# Patient Record
Sex: Male | Born: 1975 | Race: White | Hispanic: No | Marital: Married | Smoking: Former smoker
Health system: Southern US, Community
[De-identification: ages and names within clinical notes are randomized; demographics above are authoritative.]

## PROBLEM LIST (undated history)

## (undated) DIAGNOSIS — M549 Dorsalgia, unspecified: Secondary | ICD-10-CM

## (undated) DIAGNOSIS — W3400XA Accidental discharge from unspecified firearms or gun, initial encounter: Secondary | ICD-10-CM

## (undated) DIAGNOSIS — E7800 Pure hypercholesterolemia, unspecified: Secondary | ICD-10-CM

---

## 2011-10-19 ENCOUNTER — Emergency Department (HOSPITAL_COMMUNITY): Payer: BC Managed Care – PPO

## 2011-10-19 ENCOUNTER — Emergency Department (HOSPITAL_COMMUNITY)
Admission: EM | Admit: 2011-10-19 | Discharge: 2011-10-19 | Disposition: A | Discharge status: Home Health | Payer: BC Managed Care – PPO | Attending: Emergency Medicine | Admitting: Emergency Medicine

## 2011-10-19 ENCOUNTER — Encounter (HOSPITAL_COMMUNITY): Payer: Self-pay

## 2011-10-19 DIAGNOSIS — S46909A Unspecified injury of unspecified muscle, fascia and tendon at shoulder and upper arm level, unspecified arm, initial encounter: Secondary | ICD-10-CM | POA: Insufficient documentation

## 2011-10-19 DIAGNOSIS — F17200 Nicotine dependence, unspecified, uncomplicated: Secondary | ICD-10-CM | POA: Insufficient documentation

## 2011-10-19 DIAGNOSIS — R296 Repeated falls: Secondary | ICD-10-CM | POA: Insufficient documentation

## 2011-10-19 DIAGNOSIS — M25519 Pain in unspecified shoulder: Secondary | ICD-10-CM | POA: Insufficient documentation

## 2011-10-19 DIAGNOSIS — S46009A Unspecified injury of muscle(s) and tendon(s) of the rotator cuff of unspecified shoulder, initial encounter: Secondary | ICD-10-CM

## 2011-10-19 DIAGNOSIS — S40011A Contusion of right shoulder, initial encounter: Secondary | ICD-10-CM

## 2011-10-19 DIAGNOSIS — S40019A Contusion of unspecified shoulder, initial encounter: Secondary | ICD-10-CM | POA: Insufficient documentation

## 2011-10-19 DIAGNOSIS — S4980XA Other specified injuries of shoulder and upper arm, unspecified arm, initial encounter: Secondary | ICD-10-CM | POA: Insufficient documentation

## 2011-10-19 MED ORDER — CYCLOBENZAPRINE HCL 10 MG PO TABS: 10.0000 mg | ORAL_TABLET | Freq: Three times a day (TID) | ORAL | Status: AC | PRN

## 2011-10-19 MED ORDER — IBUPROFEN 800 MG PO TABS
800.0000 mg | ORAL_TABLET | Freq: Once | ORAL | Status: AC
  Administered 2011-10-19: 800 mg via ORAL
  Filled 2011-10-19: qty 1

## 2011-10-19 MED ORDER — TRAMADOL-ACETAMINOPHEN 37.5-325 MG PO TABS: ORAL_TABLET | ORAL | Status: AC

## 2011-10-19 MED ORDER — CYCLOBENZAPRINE HCL 10 MG PO TABS: 10.0000 mg | ORAL_TABLET | Freq: Three times a day (TID) | ORAL | Status: DC | PRN

## 2011-10-19 MED ORDER — HYDROCODONE-ACETAMINOPHEN 5-325 MG PO TABS
2.0000 | ORAL_TABLET | Freq: Once | ORAL | Status: AC
  Administered 2011-10-19: 2 via ORAL
  Filled 2011-10-19 (×2): qty 1

## 2011-10-19 NOTE — Discharge Instructions (Signed)
Use the ice packs for comfort. Wear the sling for comfort, you can take it off to bathe. Take the ultracet for pain with the flexeril and ibuprofen 600 mg 4 times a day.  Call Dr Aluisio's office to get an appointment to have your shoulder reevaluated for a rotator cuff injury.
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
## 2011-10-19 NOTE — ED Notes (Signed)
Pt fell and landed on right shoulder, heard something stretched and popped. Pain at Right shoulder.

## 2013-08-10 ENCOUNTER — Emergency Department (HOSPITAL_COMMUNITY)
Admission: EM | Admit: 2013-08-10 | Discharge: 2013-08-10 | Disposition: A | Discharge status: Home / Self Care | Payer: BC Managed Care – PPO | Attending: Emergency Medicine | Admitting: Emergency Medicine

## 2013-08-10 ENCOUNTER — Encounter (HOSPITAL_COMMUNITY): Payer: Self-pay | Admitting: Emergency Medicine

## 2013-08-10 ENCOUNTER — Emergency Department (HOSPITAL_COMMUNITY): Payer: BC Managed Care – PPO

## 2013-08-10 DIAGNOSIS — Y9389 Activity, other specified: Secondary | ICD-10-CM | POA: Insufficient documentation

## 2013-08-10 DIAGNOSIS — S81009A Unspecified open wound, unspecified knee, initial encounter: Secondary | ICD-10-CM | POA: Insufficient documentation

## 2013-08-10 DIAGNOSIS — Z79899 Other long term (current) drug therapy: Secondary | ICD-10-CM | POA: Insufficient documentation

## 2013-08-10 DIAGNOSIS — Y92410 Unspecified street and highway as the place of occurrence of the external cause: Secondary | ICD-10-CM | POA: Insufficient documentation

## 2013-08-10 DIAGNOSIS — Z87891 Personal history of nicotine dependence: Secondary | ICD-10-CM | POA: Insufficient documentation

## 2013-08-10 DIAGNOSIS — Z23 Encounter for immunization: Secondary | ICD-10-CM | POA: Insufficient documentation

## 2013-08-10 DIAGNOSIS — S81812A Laceration without foreign body, left lower leg, initial encounter: Secondary | ICD-10-CM

## 2013-08-10 DIAGNOSIS — E7800 Pure hypercholesterolemia, unspecified: Secondary | ICD-10-CM | POA: Insufficient documentation

## 2013-08-10 HISTORY — DX: Pure hypercholesterolemia, unspecified: E78.00

## 2013-08-10 HISTORY — DX: Accidental discharge from unspecified firearms or gun, initial encounter: W34.00XA

## 2013-08-10 HISTORY — DX: Dorsalgia, unspecified: M54.9

## 2013-08-10 MED ORDER — OXYCODONE-ACETAMINOPHEN 5-325 MG PO TABS
2.0000 | ORAL_TABLET | Freq: Once | ORAL | Status: AC
  Administered 2013-08-10: 2 via ORAL
  Filled 2013-08-10: qty 2

## 2013-08-10 MED ORDER — TETANUS-DIPHTHERIA TOXOIDS TD 5-2 LFU IM INJ
0.5000 mL | INJECTION | Freq: Once | INTRAMUSCULAR | Status: DC
  Filled 2013-08-10: qty 0.5

## 2013-08-10 MED ORDER — TETANUS-DIPHTH-ACELL PERTUSSIS 5-2.5-18.5 LF-MCG/0.5 IM SUSP
0.5000 mL | Freq: Once | INTRAMUSCULAR | Status: AC
  Administered 2013-08-10: 0.5 mL via INTRAMUSCULAR

## 2013-08-10 MED ORDER — OXYCODONE-ACETAMINOPHEN 7.5-325 MG PO TABS: 1.0000 | ORAL_TABLET | ORAL | Status: DC | PRN

## 2013-08-10 NOTE — ED Notes (Signed)
Patient transported to X-ray 

## 2013-08-10 NOTE — ED Notes (Signed)
Pt still in Radiology at this time.
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
## 2013-08-24 ENCOUNTER — Emergency Department (HOSPITAL_COMMUNITY)
Admission: EM | Admit: 2013-08-24 | Discharge: 2013-08-24 | Disposition: A | Discharge status: Home / Self Care | Payer: BC Managed Care – PPO | Attending: Emergency Medicine | Admitting: Emergency Medicine

## 2013-08-24 ENCOUNTER — Encounter (HOSPITAL_COMMUNITY): Payer: Self-pay | Admitting: Emergency Medicine

## 2013-08-24 DIAGNOSIS — Z4802 Encounter for removal of sutures: Secondary | ICD-10-CM

## 2013-08-24 DIAGNOSIS — E7800 Pure hypercholesterolemia, unspecified: Secondary | ICD-10-CM | POA: Insufficient documentation

## 2013-08-24 DIAGNOSIS — Z79899 Other long term (current) drug therapy: Secondary | ICD-10-CM | POA: Insufficient documentation

## 2013-08-24 DIAGNOSIS — Z87891 Personal history of nicotine dependence: Secondary | ICD-10-CM | POA: Insufficient documentation
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
## 2013-08-24 NOTE — ED Provider Notes (Signed)
Medical screening examination/treatment/procedure(s) were performed by non-physician practitioner and as supervising physician I was immediately available for consultation/collaboration.  EKG Interpretation   None         Crysten Kaman, MD 08/24/13 1543 

## 2013-08-24 NOTE — Discharge Instructions (Signed)
Continue taking clindamycin until finished. Monitor wound for increased signs of redness, swelling, drainage, etc.-- return to the ED if these symptoms occur.

## 2013-08-24 NOTE — ED Notes (Signed)
Staples placed here two weeks ago. Seen PCP last week. Placed on Clindamycin for wound complications.

## 2016-03-31 ENCOUNTER — Encounter (HOSPITAL_COMMUNITY): Payer: Self-pay | Admitting: Emergency Medicine

## 2016-03-31 ENCOUNTER — Emergency Department (HOSPITAL_COMMUNITY): Payer: BLUE CROSS/BLUE SHIELD

## 2016-03-31 ENCOUNTER — Emergency Department (HOSPITAL_COMMUNITY)
Admission: EM | Admit: 2016-03-31 | Discharge: 2016-03-31 | Disposition: A | Discharge status: Home / Self Care | Payer: BLUE CROSS/BLUE SHIELD | Attending: Emergency Medicine | Admitting: Emergency Medicine

## 2016-03-31 DIAGNOSIS — R1013 Epigastric pain: Secondary | ICD-10-CM | POA: Diagnosis not present

## 2016-03-31 DIAGNOSIS — Z87891 Personal history of nicotine dependence: Secondary | ICD-10-CM | POA: Diagnosis not present

## 2016-03-31 DIAGNOSIS — R109 Unspecified abdominal pain: Secondary | ICD-10-CM

## 2016-03-31 LAB — COMPREHENSIVE METABOLIC PANEL
ALBUMIN: 4.1 g/dL (ref 3.5–5.0)
ALK PHOS: 66 U/L (ref 38–126)
ALT: 24 U/L (ref 17–63)
AST: 22 U/L (ref 15–41)
Anion gap: 8 (ref 5–15)
BUN: 11 mg/dL (ref 6–20)
CALCIUM: 9.7 mg/dL (ref 8.9–10.3)
CHLORIDE: 102 mmol/L (ref 101–111)
CO2: 27 mmol/L (ref 22–32)
CREATININE: 0.96 mg/dL (ref 0.61–1.24)
GFR calc non Af Amer: >60 mL/min (ref >60)
Glucose, Bld: 124 mg/dL — ABNORMAL HIGH (ref 65–99)
Potassium: 3.7 mmol/L (ref 3.5–5.1)
SODIUM: 137 mmol/L (ref 135–145)
Total Bilirubin: 0.6 mg/dL (ref 0.3–1.2)
Total Protein: 7 g/dL (ref 6.5–8.1)

## 2016-03-31 LAB — CBC
HCT: 46.5 % (ref 39.0–52.0)
Hemoglobin: 15.8 g/dL (ref 13.0–17.0)
MCH: 30.6 pg (ref 26.0–34.0)
MCHC: 34 g/dL (ref 30.0–36.0)
MCV: 89.9 fL (ref 78.0–100.0)
PLATELETS: 235 10*3/uL (ref 150–400)
RBC: 5.17 MIL/uL (ref 4.22–5.81)
RDW: 12.6 % (ref 11.5–15.5)
WBC: 9.1 10*3/uL (ref 4.0–10.5)

## 2016-03-31 LAB — URINALYSIS, ROUTINE W REFLEX MICROSCOPIC
Bilirubin Urine: NEGATIVE
Glucose, UA: NEGATIVE mg/dL
Hgb urine dipstick: NEGATIVE
Ketones, ur: 15 mg/dL — AB
LEUKOCYTES UA: NEGATIVE
NITRITE: NEGATIVE
PROTEIN: NEGATIVE mg/dL
Specific Gravity, Urine: 1.014 (ref 1.005–1.030)
pH: 8.5 — ABNORMAL HIGH (ref 5.0–8.0)

## 2016-03-31 LAB — LIPASE, BLOOD: LIPASE: 25 U/L (ref 11–51)

## 2016-03-31 LAB — CBG MONITORING, ED: GLUCOSE-CAPILLARY: 116 mg/dL — AB (ref 65–99)

## 2016-03-31 MED ORDER — SODIUM CHLORIDE 0.9 % IV SOLN
Freq: Once | INTRAVENOUS | Status: AC
  Administered 2016-03-31: 09:00:00 via INTRAVENOUS

## 2016-03-31 MED ORDER — MORPHINE SULFATE (PF) 4 MG/ML IV SOLN
4.0000 mg | Freq: Once | INTRAVENOUS | Status: AC
  Administered 2016-03-31: 4 mg via INTRAVENOUS
  Filled 2016-03-31: qty 1

## 2016-03-31 MED ORDER — DICYCLOMINE HCL 10 MG/ML IM SOLN
20.0000 mg | Freq: Once | INTRAMUSCULAR | Status: AC
Start: 2016-03-31 — End: 2016-03-31
  Administered 2016-03-31: 20 mg via INTRAMUSCULAR
  Filled 2016-03-31: qty 2

## 2016-03-31 MED ORDER — ONDANSETRON HCL 4 MG/2ML IJ SOLN
4.0000 mg | Freq: Once | INTRAMUSCULAR | Status: AC
  Administered 2016-03-31: 4 mg via INTRAVENOUS
  Filled 2016-03-31: qty 2

## 2016-03-31 MED ORDER — ONDANSETRON 4 MG PO TBDP: 4.0000 mg | ORAL_TABLET | Freq: Three times a day (TID) | ORAL | 0 refills | Status: DC | PRN

## 2016-03-31 MED ORDER — GI COCKTAIL ~~LOC~~
30.0000 mL | Freq: Once | ORAL | Status: AC
  Administered 2016-03-31: 30 mL via ORAL
  Filled 2016-03-31: qty 30

## 2016-03-31 MED ORDER — SODIUM CHLORIDE 0.9 % IV BOLUS (SEPSIS)
1000.0000 mL | Freq: Once | INTRAVENOUS | Status: AC
  Administered 2016-03-31: 1000 mL via INTRAVENOUS

## 2016-03-31 MED ORDER — ESOMEPRAZOLE MAGNESIUM 40 MG PO CPDR: 40.0000 mg | DELAYED_RELEASE_CAPSULE | Freq: Every day | ORAL | 0 refills | Status: DC

## 2016-03-31 MED ORDER — IOPAMIDOL (ISOVUE-300) INJECTION 61%
INTRAVENOUS | Status: AC
  Administered 2016-03-31: 100 mL
  Filled 2016-03-31: qty 100
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
## 2016-03-31 NOTE — ED Notes (Signed)
Provided pt with ice chips per Marylene LandAngela, RN

## 2016-03-31 NOTE — Discharge Instructions (Signed)
Zofran as needed for nausea.   Please seek immediate care if you develop any of the following symptoms: The pain significantly worsens or does not go away in 3 days.  You have a fever.  You keep throwing up (vomiting).  You pass bloody or black tarry stools.  There is bright red blood in the stool.  There is rectal pain.  You do not seem to be getting better.  You have any questions or concerns.
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
## 2016-04-07 ENCOUNTER — Encounter: Payer: Self-pay | Admitting: Gastroenterology

## 2016-04-07 ENCOUNTER — Encounter (INDEPENDENT_AMBULATORY_CARE_PROVIDER_SITE_OTHER): Payer: Self-pay

## 2016-04-07 ENCOUNTER — Ambulatory Visit (INDEPENDENT_AMBULATORY_CARE_PROVIDER_SITE_OTHER): Payer: BLUE CROSS/BLUE SHIELD | Admitting: Gastroenterology

## 2016-04-07 VITALS — BP 112/74 | HR 72 | Ht 70.08 in | Wt 206.4 lb

## 2016-04-07 DIAGNOSIS — R1013 Epigastric pain: Secondary | ICD-10-CM

## 2016-04-07 NOTE — Patient Instructions (Addendum)
Stay on nexium 40mg  pill, 20-30 min before breakfast meal. Ranitidine 150mg  pills, take one pill at bedtime nightly. You will be set up for an upper endoscopy for epigastric pain next Tuesday ([facility]).
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
## 2016-04-11 ENCOUNTER — Encounter: Payer: Self-pay | Admitting: Gastroenterology

## 2016-04-11 ENCOUNTER — Ambulatory Visit (AMBULATORY_SURGERY_CENTER): Payer: BLUE CROSS/BLUE SHIELD | Admitting: Gastroenterology

## 2016-04-11 VITALS — BP 124/71 | HR 77 | Temp 97.3°F | Resp 16 | Ht 70.0 in | Wt 206.0 lb

## 2016-04-11 DIAGNOSIS — R1013 Epigastric pain: Secondary | ICD-10-CM | POA: Diagnosis present

## 2016-04-11 DIAGNOSIS — K2970 Gastritis, unspecified, without bleeding: Secondary | ICD-10-CM | POA: Diagnosis not present

## 2016-04-11 DIAGNOSIS — K2950 Unspecified chronic gastritis without bleeding: Secondary | ICD-10-CM | POA: Diagnosis not present

## 2016-04-11 DIAGNOSIS — K2990 Gastroduodenitis, unspecified, without bleeding: Secondary | ICD-10-CM | POA: Diagnosis not present

## 2016-04-11 MED ORDER — RANITIDINE HCL 150 MG PO TABS: 150.0000 mg | ORAL_TABLET | Freq: Every day | ORAL | 11 refills | Status: DC

## 2016-04-11 MED ORDER — ESOMEPRAZOLE MAGNESIUM 40 MG PO CPDR: 40.0000 mg | DELAYED_RELEASE_CAPSULE | Freq: Every day | ORAL | 11 refills | Status: DC

## 2016-04-11 MED ORDER — SODIUM CHLORIDE 0.9 % IV SOLN: 500.0000 mL | INTRAVENOUS | Status: DC

## 2016-04-11 NOTE — Progress Notes (Signed)
Report to PACU, RN, vss, BBS= Clear.  

## 2016-04-11 NOTE — Patient Instructions (Addendum)
YOU HAD AN ENDOSCOPIC PROCEDURE TODAY AT THE [facility]:   Refer to the procedure report that was given to you for any specific questions about what was found during the examination.  If the procedure report does not answer your questions, please call your gastroenterologist to clarify.  If you requested that your care partner not be given the details of your procedure findings, then the procedure report has been included in a sealed envelope for you to review at your convenience later.  YOU SHOULD EXPECT: Some feelings of bloating in the abdomen. Passage of more gas than usual.  Walking can help get rid of the air that was put into your GI tract during the procedure and reduce the bloating. If you had a lower endoscopy (such as a colonoscopy or flexible sigmoidoscopy) you may notice spotting of blood in your stool or on the toilet paper. If you underwent a bowel prep for your procedure, you may not have a normal bowel movement for a few days.  Please Note:  You might notice some irritation and congestion in your nose or some drainage.  This is from the oxygen used during your procedure.  There is no need for concern and it should clear up in a day or so.  SYMPTOMS TO REPORT IMMEDIATELY:   Following lower endoscopy (colonoscopy or flexible sigmoidoscopy):  Excessive amounts of blood in the stool  Significant tenderness or worsening of abdominal pains  Swelling of the abdomen that is new, acute  Fever of 100F or higher   For urgent or emergent issues, a gastroenterologist can be reached at any hour by calling [phone].   DIET:  We do recommend a small meal at first, but then you may proceed to your regular diet.  Drink plenty of fluids but you should avoid alcoholic beverages for 24 hours. High fiber diet.  ACTIVITY:  You should plan to take it easy for the rest of today and you should NOT DRIVE or use heavy machinery until tomorrow (because of the sedation medicines used  during the test).    FOLLOW UP: Our staff will call the number listed on your records the next business day following your procedure to check on you and address any questions or concerns that you may have regarding the information given to you following your procedure. If we do not reach you, we will leave a message.  However, if you are feeling well and you are not experiencing any problems, there is no need to return our call.  We will assume that you have returned to your regular daily activities without incident.  If any biopsies were taken you will be contacted by phone or by letter within the next 1-3 weeks.  Please call us at [phone] if you have not heard about the biopsies in 3 weeks.    SIGNATURES/CONFIDENTIALITY: You and/or your care partner have signed paperwork which will be entered into your electronic medical record.  These signatures attest to the fact that that the information above on your After Visit Summary has been reviewed and is understood.  Full responsibility of the confidentiality of this discharge information lies with you and/or your care-partner.  Read all of the handouts given to you by your recovery room nurse.  Thank-you for choosing us for your healthcare today.YOU HAD AN ENDOSCOPIC PROCEDURE TODAY AT THE [facility]:   Refer to the procedure report that was given to you for any specific questions about what was found  during the examination.  If the procedure report does not answer your questions, please call your gastroenterologist to clarify.  If you requested that your care partner not be given the details of your procedure findings, then the procedure report has been included in a sealed envelope for you to review at your convenience later.  YOU SHOULD EXPECT: Some feelings of bloating in the abdomen. Passage of more gas than usual.  Walking can help get rid of the air that was put into your GI tract during the procedure and reduce the  bloating.  Please Note:  You might notice some irritation and congestion in your nose or some drainage.  This is from the oxygen used during your procedure.  There is no need for concern and it should clear up in a day or so.  SYMPTOMS TO REPORT IMMEDIATELY:    Following upper endoscopy (EGD)  Vomiting of blood or coffee ground material  New chest pain or pain under the shoulder blades  Painful or persistently difficult swallowing  New shortness of breath  Fever of 100F or higher  Black, tarry-looking stools  For urgent or emergent issues, a gastroenterologist can be reached at any hour by calling [phone].   DIET:  We do recommend a small meal at first, but then you may proceed to your regular diet.  Drink plenty of fluids but you should avoid alcoholic beverages for 24 hours.  ACTIVITY:  You should plan to take it easy for the rest of today and you should NOT DRIVE or use heavy machinery until tomorrow (because of the sedation medicines used during the test).    FOLLOW UP: Our staff will call the number listed on your records the next business day following your procedure to check on you and address any questions or concerns that you may have regarding the information given to you following your procedure. If we do not reach you, we will leave a message.  However, if you are feeling well and you are not experiencing any problems, there is no need to return our call.  We will assume that you have returned to your regular daily activities without incident.  If any biopsies were taken you will be contacted by phone or by letter within the next 1-3 weeks.  Please call us at [phone] if you have not heard about the biopsies in 3 weeks.    SIGNATURES/CONFIDENTIALITY: You and/or your care partner have signed paperwork which will be entered into your electronic medical record.  These signatures attest to the fact that that the information above on your After Visit Summary has  been reviewed and is understood.  Full responsibility of the confidentiality of this discharge information lies with you and/or your care-partner.  Read all of the handouts.

## 2016-04-11 NOTE — Progress Notes (Signed)
Called to room to assist during endoscopic procedure.  Patient ID and intended procedure confirmed with present staff. Received instructions for my participation in the procedure from the performing physician.  

## 2016-04-11 NOTE — Op Note (Signed)
[facility] Patient Name: Seth Grant Procedure Date: 04/11/2016 10:51 AM MRN: [id number] Endoscopist: Rachael Feeaniel P Mckenzye Cutright , MD Age: 40 Referring MD:  Date of Birth: 05/07/1976 Gender: Male Account #: [id number] Procedure:                Upper GI endoscopy Indications:              Epigastric abdominal pain (US, CT, cbc, cmet all                            normal) Medicines:                Monitored Anesthesia Care Procedure:                Pre-Anesthesia Assessment:                           - Prior to the procedure, a History and Physical                            was performed, and patient medications and                            allergies were reviewed. The patient's tolerance of                            previous anesthesia was also reviewed. The risks                            and benefits of the procedure and the sedation                            options and risks were discussed with the patient.                            All questions were answered, and informed consent                            was obtained. Prior Anticoagulants: The patient has                            taken no previous anticoagulant or antiplatelet                            agents. ASA Grade Assessment: II - A patient with                            mild systemic disease. After reviewing the risks                            and benefits, the patient was deemed in                            satisfactory condition to undergo the procedure.  After obtaining informed consent, the endoscope was                            passed under direct vision. Throughout the                            procedure, the patient's blood pressure, pulse, and                            oxygen saturations were monitored continuously. The                            Model GIF-HQ190 [id number](SN#[id number]) scope was introduced                            through the mouth, and advanced to the second  part                            of duodenum. The upper GI endoscopy was                            accomplished without difficulty. The patient                            tolerated the procedure well. Scope In: Scope Out: Findings:                 The esophagus was normal.                           Localized mild inflammation characterized by                            erythema was found in the gastric antrum. Biopsies                            were taken with a cold forceps for histology.                           The examined duodenum was normal. Complications:            No immediate complications. Estimated blood loss:                            None. Estimated Blood Loss:     Estimated blood loss: none. Impression:               - Normal esophagus.                           - Gastritis. Biopsied.                           - Normal examined duodenum. Recommendation:           - Resume previous diet.                           -  Continue present medications (nexium 40mg  pill                            20-30 min prior to breakfast meal daily; ranitidine                            150mg  pill at bedtime nightly).                           - Await pathology results.                           - Patient has a contact number available for                            emergencies. The signs and symptoms of potential                            delayed complications were discussed with the                            patient. Return to normal activities tomorrow.                            Written discharge instructions were provided to the                            patient. Rachael Fee, MD 04/11/2016 11:07:26 AM This report has been signed electronically.

## 2016-04-12 ENCOUNTER — Telehealth: Payer: Self-pay | Admitting: *Deleted

## 2016-04-12 NOTE — Telephone Encounter (Signed)
  Follow up Call-  Call back number 04/11/2016  Post procedure Call Back phone  # [phone]  Permission to leave phone message Yes  Some recent data might be hidden     Patient questions:  Do you have a fever, pain , or abdominal swelling? No. Pain Score  0 *  Have you tolerated food without any problems? Yes.    Have you been able to return to your normal activities? Yes.    Do you have any questions about your discharge instructions: Diet   No. Medications  No. Follow up visit  No.  Do you have questions or concerns about your Care? No.  Actions: * If pain score is 4 or above: No action needed, pain <4.

## 2016-04-14 ENCOUNTER — Encounter: Payer: Self-pay | Admitting: Gastroenterology

## 2017-07-21 ENCOUNTER — Ambulatory Visit (HOSPITAL_COMMUNITY)
Admission: EM | Admit: 2017-07-21 | Discharge: 2017-07-21 | Disposition: A | Discharge status: Home / Self Care | Payer: BLUE CROSS/BLUE SHIELD | Attending: Family Medicine | Admitting: Family Medicine

## 2017-07-21 ENCOUNTER — Encounter (HOSPITAL_COMMUNITY): Payer: Self-pay | Admitting: Emergency Medicine

## 2017-07-21 DIAGNOSIS — R369 Urethral discharge, unspecified: Secondary | ICD-10-CM | POA: Insufficient documentation

## 2017-07-21 DIAGNOSIS — Z202 Contact with and (suspected) exposure to infections with a predominantly sexual mode of transmission: Secondary | ICD-10-CM | POA: Insufficient documentation

## 2017-07-21 DIAGNOSIS — Z87891 Personal history of nicotine dependence: Secondary | ICD-10-CM | POA: Insufficient documentation

## 2017-07-21 DIAGNOSIS — E7800 Pure hypercholesterolemia, unspecified: Secondary | ICD-10-CM | POA: Insufficient documentation

## 2017-07-21 MED ORDER — METRONIDAZOLE 500 MG PO TABS: 2000.0000 mg | ORAL_TABLET | Freq: Once | ORAL | 1 refills | Status: AC

## 2017-07-21 MED ORDER — CEFTRIAXONE SODIUM 250 MG IJ SOLR
250.0000 mg | Freq: Once | INTRAMUSCULAR | Status: AC
  Administered 2017-07-21: 250 mg via INTRAMUSCULAR

## 2017-07-21 MED ORDER — AZITHROMYCIN 250 MG PO TABS
1000.0000 mg | ORAL_TABLET | Freq: Once | ORAL | Status: AC
  Administered 2017-07-21: 1000 mg via ORAL

## 2017-07-21 MED ORDER — CEFTRIAXONE SODIUM 250 MG IJ SOLR
INTRAMUSCULAR | Status: AC
  Filled 2017-07-21: qty 250

## 2017-07-21 MED ORDER — AZITHROMYCIN 250 MG PO TABS
ORAL_TABLET | ORAL | Status: AC
Start: 2017-07-21 — End: ?
  Filled 2017-07-21: qty 4

## 2017-07-21 NOTE — ED Triage Notes (Signed)
PT C/O: STD concerns .... Sexually active w/male partner... Does not use condoms.   ONSET: 3 weeks  SX ALSO INCLUDE: penile d/c, urinary freq/urgency  DENIES: fevers  TAKING MEDS: none  A&O x4... NAD... Ambulatory
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
## 2017-07-23 LAB — URINE CYTOLOGY ANCILLARY ONLY
Chlamydia: NEGATIVE
Neisseria Gonorrhea: POSITIVE — AB
Trichomonas: NEGATIVE

## 2017-09-11 ENCOUNTER — Ambulatory Visit (HOSPITAL_COMMUNITY)
Admission: EM | Admit: 2017-09-11 | Discharge: 2017-09-11 | Disposition: A | Discharge status: Home / Self Care | Payer: Self-pay | Attending: Family Medicine | Admitting: Family Medicine

## 2017-09-11 ENCOUNTER — Other Ambulatory Visit: Payer: Self-pay

## 2017-09-11 ENCOUNTER — Encounter (HOSPITAL_COMMUNITY): Payer: Self-pay | Admitting: Emergency Medicine

## 2017-09-11 DIAGNOSIS — L03113 Cellulitis of right upper limb: Secondary | ICD-10-CM

## 2017-09-11 DIAGNOSIS — L03114 Cellulitis of left upper limb: Secondary | ICD-10-CM

## 2017-09-11 DIAGNOSIS — F1910 Other psychoactive substance abuse, uncomplicated: Secondary | ICD-10-CM

## 2017-09-11 MED ORDER — DOXYCYCLINE HYCLATE 100 MG PO TABS: 100.0000 mg | ORAL_TABLET | Freq: Two times a day (BID) | ORAL | 0 refills | Status: DC

## 2017-09-11 NOTE — ED Triage Notes (Signed)
Patient has 3 particularly large red, swollen abscess?Marland Kitchen.  One to left upper arm, one to right upper arm, and right forearm.  Patient states this is from iv drug use
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
## 2017-09-11 NOTE — Discharge Instructions (Signed)
[facility] "[facility] for Change" [phone]

## 2018-12-20 ENCOUNTER — Other Ambulatory Visit: Payer: Self-pay

## 2018-12-20 ENCOUNTER — Encounter (HOSPITAL_COMMUNITY): Payer: Self-pay

## 2018-12-20 ENCOUNTER — Emergency Department (HOSPITAL_COMMUNITY): Payer: Self-pay

## 2018-12-20 ENCOUNTER — Emergency Department (HOSPITAL_COMMUNITY)
Admission: EM | Admit: 2018-12-20 | Discharge: 2018-12-20 | Disposition: A | Discharge status: Home / Self Care | Payer: Self-pay | Attending: Emergency Medicine | Admitting: Emergency Medicine

## 2018-12-20 DIAGNOSIS — F17200 Nicotine dependence, unspecified, uncomplicated: Secondary | ICD-10-CM | POA: Insufficient documentation

## 2018-12-20 DIAGNOSIS — Z79899 Other long term (current) drug therapy: Secondary | ICD-10-CM | POA: Insufficient documentation

## 2018-12-20 DIAGNOSIS — L03115 Cellulitis of right lower limb: Secondary | ICD-10-CM | POA: Insufficient documentation

## 2018-12-20 LAB — CBC WITH DIFFERENTIAL/PLATELET
Abs Immature Granulocytes: 0.02 10*3/uL (ref 0.00–0.07)
Basophils Absolute: 0 10*3/uL (ref 0.0–0.1)
Basophils Relative: 0 %
Eosinophils Absolute: 0.1 10*3/uL (ref 0.0–0.5)
Eosinophils Relative: 2 %
HCT: 44.7 % (ref 39.0–52.0)
Hemoglobin: 14.6 g/dL (ref 13.0–17.0)
Immature Granulocytes: 0 %
Lymphocytes Relative: 26 %
Lymphs Abs: 1.8 10*3/uL (ref 0.7–4.0)
MCH: 28.8 pg (ref 26.0–34.0)
MCHC: 32.7 g/dL (ref 30.0–36.0)
MCV: 88.2 fL (ref 80.0–100.0)
Monocytes Absolute: 0.7 10*3/uL (ref 0.1–1.0)
Monocytes Relative: 10 %
Neutro Abs: 4.3 10*3/uL (ref 1.7–7.7)
Neutrophils Relative %: 62 %
Platelets: 303 10*3/uL (ref 150–400)
RBC: 5.07 MIL/uL (ref 4.22–5.81)
RDW: 12.6 % (ref 11.5–15.5)
WBC: 7 10*3/uL (ref 4.0–10.5)
nRBC: 0 % (ref 0.0–0.2)

## 2018-12-20 LAB — COMPREHENSIVE METABOLIC PANEL
ALT: 30 U/L (ref 0–44)
AST: 26 U/L (ref 15–41)
Albumin: 3.8 g/dL (ref 3.5–5.0)
Alkaline Phosphatase: 85 U/L (ref 38–126)
Anion gap: 12 (ref 5–15)
BUN: 10 mg/dL (ref 6–20)
CO2: 24 mmol/L (ref 22–32)
Calcium: 9.7 mg/dL (ref 8.9–10.3)
Chloride: 103 mmol/L (ref 98–111)
Creatinine, Ser: 1.01 mg/dL (ref 0.61–1.24)
GFR calc Af Amer: >60 mL/min (ref >60)
GFR calc non Af Amer: >60 mL/min (ref >60)
Glucose, Bld: 133 mg/dL — ABNORMAL HIGH (ref 70–99)
Potassium: 3.5 mmol/L (ref 3.5–5.1)
Sodium: 139 mmol/L (ref 135–145)
Total Bilirubin: 0.9 mg/dL (ref 0.3–1.2)
Total Protein: 7.3 g/dL (ref 6.5–8.1)

## 2018-12-20 LAB — RAPID URINE DRUG SCREEN, HOSP PERFORMED
Amphetamines: POSITIVE — AB
Barbiturates: NOT DETECTED
Benzodiazepines: NOT DETECTED
Cocaine: POSITIVE — AB
Opiates: NOT DETECTED
Tetrahydrocannabinol: NOT DETECTED

## 2018-12-20 LAB — SEDIMENTATION RATE: Sed Rate: 13 mm/hr (ref 0–16)

## 2018-12-20 LAB — C-REACTIVE PROTEIN: CRP: 1.3 mg/dL — ABNORMAL HIGH (ref <1.0)

## 2018-12-20 MED ORDER — VANCOMYCIN HCL IN DEXTROSE 1-5 GM/200ML-% IV SOLN
1000.0000 mg | Freq: Once | INTRAVENOUS | Status: AC
  Administered 2018-12-20: 13:00:00 1000 mg via INTRAVENOUS
  Filled 2018-12-20: qty 200

## 2018-12-20 MED ORDER — PIPERACILLIN-TAZOBACTAM 3.375 G IVPB 30 MIN
3.3750 g | Freq: Once | INTRAVENOUS | Status: AC
  Administered 2018-12-20: 12:00:00 3.375 g via INTRAVENOUS
  Filled 2018-12-20: qty 50

## 2018-12-20 MED ORDER — CEPHALEXIN 500 MG PO CAPS: 500.0000 mg | ORAL_CAPSULE | Freq: Four times a day (QID) | ORAL | 0 refills | Status: AC

## 2018-12-20 NOTE — ED Triage Notes (Signed)
Pt stated several days ago the top of his Left foot appeared to swell, then upon arrival to ED today his foot is red, swollen, warm & his Great toe feels numb.

## 2018-12-20 NOTE — Discharge Instructions (Addendum)
Today we evaluated the infection that is going on in your right foot.  After we looked at the MRI, there does not appear to be any sign of infection inside the bone.  There is also not any sign of an abscess in your foot.  We will be treating this is cellulitis reviewing your case with the orthopedic surgeon, Dr. Roda ShuttersXu.  We will be ordering Keflex that you need to take 4 times per day for the next 10 days.  You should be getting a call from Dr. Gracy RacerZu's office to schedule an appointment.  He works at [employer], so if you do not hear from them is important that you call him yourself.  You should see him within the next 3 to 4 days, so please call them tomorrow.  If you start getting fevers or you start noticing streaking coming up the leg from the wound or you have trouble feeling her toes it is important that you get to the emergency department immediately for evaluation because that could be sign that your infection is started to get worse.  We have also asked the social worker to try and connect you with a primary care doctor because you need to start following for some of your chronic conditions.  As we talked about in the office, where the best thing that you could do for your health is to seek abstinence programs to help you with your drug use.    Cellulitis, Adult  Cellulitis is a skin infection. The infected area is often warm, red, swollen, and sore. It occurs most often in the arms and lower legs. It is very important to get treated for this condition. What are the causes? This condition is caused by bacteria. The bacteria enter through a break in the skin, such as a cut, burn, insect bite, open sore, or crack. What increases the risk? This condition is more likely to occur in people who: Have a weak body defense system (immune system). Have open cuts, burns, bites, or scrapes on the skin. Are older than 43 years of age. Have a blood sugar problem (diabetes). Have a long-lasting (chronic)  liver disease (cirrhosis) or kidney disease. Are very overweight (obese). Have a skin problem, such as: Itchy rash (eczema). Slow movement of blood in the veins (venous stasis). Fluid buildup below the skin (edema). Have been treated with high-energy rays (radiation). Use IV drugs. What are the signs or symptoms? Symptoms of this condition include: Skin that is: Red. Streaking. Spotting. Swollen. Sore or painful when you touch it. Warm. A fever. Chills. Blisters. How is this diagnosed? This condition is diagnosed based on: Medical history. Physical exam. Blood tests. Imaging tests. How is this treated? Treatment for this condition may include: Medicines to treat infections or allergies. Home care, such as: Rest. Placing cold or warm cloths (compresses) on the skin. Hospital care, if the condition is very bad. Follow these instructions at home: Medicines Take over-the-counter and prescription medicines only as told by your doctor. If you were prescribed an antibiotic medicine, take it as told by your doctor. Do not stop taking it even if you start to feel better. General instructions  Drink enough fluid to keep your pee (urine) pale yellow. Do not touch or rub the infected area. Raise (elevate) the infected area above the level of your heart while you are sitting or lying down. Place cold or warm cloths on the area as told by your doctor. Keep all follow-up visits as told  by your doctor. This is important. Contact a doctor if: You have a fever. You do not start to get better after 1-2 days of treatment. Your bone or joint under the infected area starts to hurt after the skin has healed. Your infection comes back. This can happen in the same area or another area. You have a swollen bump in the area. You have new symptoms. You feel ill and have muscle aches and pains. Get help right away if: Your symptoms get worse. You feel very sleepy. You throw up (vomit) or  have watery poop (diarrhea) for a long time. You see red streaks coming from the area. Your red area gets larger. Your red area turns dark in color. These symptoms may represent a serious problem that is an emergency. Do not wait to see if the symptoms will go away. Get medical help right away. Call your local emergency services (911 in the [place] Do not drive yourself to the hospital. Summary Cellulitis is a skin infection. The area is often warm, red, swollen, and sore. This condition is treated with medicines, rest, and cold and warm cloths. Take all medicines only as told by your doctor. Tell your doctor if symptoms do not start to get better after 1-2 days of treatment. This information is not intended to replace advice given to you by your health care provider. Make sure you discuss any questions you have with your health care provider. Document Released: 01/24/2008 Document Revised: 12/27/2017 Document Reviewed: 12/27/2017 Elsevier Interactive Patient Education  2019 [employer].

## 2018-12-20 NOTE — ED Notes (Signed)
Patient transported to MRI 

## 2018-12-20 NOTE — Progress Notes (Signed)
Paged about admission review for patient with osteomyelitis vs cellulitis likely 2/2 IV drug use.  Stable vitals, no fever, Xray neg for osteomyelitis.  Says they were taking a friend's clindamycin for.  Ordered ESR/CRP/MRI w/ contrast (spoke with ortho and radiology). If these do no indicate osteo, we will d/c to outpatient f/u with Dr. Erlinda Hong (ortho)  -Dr. Criss Rosales

## 2018-12-20 NOTE — ED Notes (Signed)
Spoke with doctor and lab able to add sedimentation rate not the C-reactive protein.
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
## 2018-12-20 NOTE — ED Notes (Addendum)
IV in Rt AC went in with good flash back but would not thread, labs were drawn but IV was in poor position & could not be left in.

## 2018-12-20 NOTE — Progress Notes (Signed)
CSW received consult for patient due to him needing a PCP. CSW notified RN CM to assist patient with that need.  Edwin Dada, MSW, LCSW-A Clinical Social Worker Emergency Department [employer] [phone]

## 2018-12-20 NOTE — ED Notes (Signed)
EDP at bedside  

## 2018-12-22 ENCOUNTER — Telehealth: Payer: Self-pay | Admitting: Surgery

## 2018-12-22 NOTE — Telephone Encounter (Addendum)
Patient call concerning prescription that was e-scribed 5/1. Spoke with EDP prescription called in to pharmacy, no further ED CM needs
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
## 2018-12-25 LAB — CULTURE, BLOOD (ROUTINE X 2)
Culture: NO GROWTH
Culture: NO GROWTH
Special Requests: ADEQUATE
Special Requests: ADEQUATE

## 2019-01-01 ENCOUNTER — Ambulatory Visit: Payer: Self-pay | Admitting: Orthopaedic Surgery

## 2019-01-01 ENCOUNTER — Other Ambulatory Visit: Payer: Self-pay

## 2019-01-01 ENCOUNTER — Encounter: Payer: Self-pay | Admitting: Orthopaedic Surgery

## 2019-01-01 DIAGNOSIS — L03115 Cellulitis of right lower limb: Secondary | ICD-10-CM

## 2019-01-01 MED ORDER — CEPHALEXIN 500 MG PO CAPS: 500.0000 mg | ORAL_CAPSULE | Freq: Four times a day (QID) | ORAL | 0 refills | Status: DC
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
## 2019-01-27 ENCOUNTER — Encounter: Payer: Self-pay | Admitting: Surgery

## 2019-09-24 ENCOUNTER — Ambulatory Visit: Payer: Self-pay | Attending: Internal Medicine

## 2019-12-03 ENCOUNTER — Encounter (HOSPITAL_COMMUNITY): Payer: Self-pay | Admitting: Emergency Medicine

## 2019-12-03 ENCOUNTER — Emergency Department (HOSPITAL_COMMUNITY)
Admission: EM | Admit: 2019-12-03 | Discharge: 2019-12-03 | Disposition: A | Discharge status: Home / Self Care | Payer: Self-pay | Attending: Emergency Medicine | Admitting: Emergency Medicine

## 2019-12-03 ENCOUNTER — Emergency Department (HOSPITAL_COMMUNITY): Payer: Self-pay

## 2019-12-03 ENCOUNTER — Other Ambulatory Visit: Payer: Self-pay

## 2019-12-03 DIAGNOSIS — R4189 Other symptoms and signs involving cognitive functions and awareness: Secondary | ICD-10-CM | POA: Insufficient documentation

## 2019-12-03 DIAGNOSIS — K5792 Diverticulitis of intestine, part unspecified, without perforation or abscess without bleeding: Secondary | ICD-10-CM | POA: Insufficient documentation

## 2019-12-03 DIAGNOSIS — Z79899 Other long term (current) drug therapy: Secondary | ICD-10-CM | POA: Insufficient documentation

## 2019-12-03 DIAGNOSIS — F17220 Nicotine dependence, chewing tobacco, uncomplicated: Secondary | ICD-10-CM | POA: Insufficient documentation

## 2019-12-03 DIAGNOSIS — R4182 Altered mental status, unspecified: Secondary | ICD-10-CM | POA: Insufficient documentation

## 2019-12-03 LAB — LIPASE, BLOOD: Lipase: 28 U/L (ref 11–51)

## 2019-12-03 LAB — CBG MONITORING, ED: Glucose-Capillary: 125 mg/dL — ABNORMAL HIGH (ref 70–99)

## 2019-12-03 LAB — CBC WITH DIFFERENTIAL/PLATELET
Abs Immature Granulocytes: 0.01 10*3/uL (ref 0.00–0.07)
Basophils Absolute: 0 10*3/uL (ref 0.0–0.1)
Basophils Relative: 0 %
Eosinophils Absolute: 0.1 10*3/uL (ref 0.0–0.5)
Eosinophils Relative: 1 %
HCT: 49.1 % (ref 39.0–52.0)
Hemoglobin: 15.5 g/dL (ref 13.0–17.0)
Immature Granulocytes: 0 %
Lymphocytes Relative: 16 %
Lymphs Abs: 1.3 10*3/uL (ref 0.7–4.0)
MCH: 28.4 pg (ref 26.0–34.0)
MCHC: 31.6 g/dL (ref 30.0–36.0)
MCV: 90.1 fL (ref 80.0–100.0)
Monocytes Absolute: 0.8 10*3/uL (ref 0.1–1.0)
Monocytes Relative: 10 %
Neutro Abs: 5.8 10*3/uL (ref 1.7–7.7)
Neutrophils Relative %: 73 %
Platelets: 257 10*3/uL (ref 150–400)
RBC: 5.45 MIL/uL (ref 4.22–5.81)
RDW: 13.4 % (ref 11.5–15.5)
WBC: 8 10*3/uL (ref 4.0–10.5)
nRBC: 0 % (ref 0.0–0.2)

## 2019-12-03 LAB — COMPREHENSIVE METABOLIC PANEL
ALT: 26 U/L (ref 0–44)
AST: 23 U/L (ref 15–41)
Albumin: 4.2 g/dL (ref 3.5–5.0)
Alkaline Phosphatase: 86 U/L (ref 38–126)
Anion gap: 10 (ref 5–15)
BUN: 11 mg/dL (ref 6–20)
CO2: 25 mmol/L (ref 22–32)
Calcium: 9.6 mg/dL (ref 8.9–10.3)
Chloride: 106 mmol/L (ref 98–111)
Creatinine, Ser: 0.99 mg/dL (ref 0.61–1.24)
GFR calc Af Amer: >60 mL/min (ref >60)
GFR calc non Af Amer: >60 mL/min (ref >60)
Glucose, Bld: 139 mg/dL — ABNORMAL HIGH (ref 70–99)
Potassium: 4 mmol/L (ref 3.5–5.1)
Sodium: 141 mmol/L (ref 135–145)
Total Bilirubin: 0.6 mg/dL (ref 0.3–1.2)
Total Protein: 7.8 g/dL (ref 6.5–8.1)

## 2019-12-03 LAB — URINALYSIS, ROUTINE W REFLEX MICROSCOPIC
Bilirubin Urine: NEGATIVE
Glucose, UA: NEGATIVE mg/dL
Hgb urine dipstick: NEGATIVE
Ketones, ur: NEGATIVE mg/dL
Leukocytes,Ua: NEGATIVE
Nitrite: NEGATIVE
Protein, ur: NEGATIVE mg/dL
Specific Gravity, Urine: >1.046 — ABNORMAL HIGH (ref 1.005–1.030)
pH: 5 (ref 5.0–8.0)

## 2019-12-03 LAB — RAPID URINE DRUG SCREEN, HOSP PERFORMED
Amphetamines: POSITIVE — AB
Barbiturates: NOT DETECTED
Benzodiazepines: NOT DETECTED
Cocaine: NOT DETECTED
Opiates: NOT DETECTED
Tetrahydrocannabinol: POSITIVE — AB

## 2019-12-03 LAB — ACETAMINOPHEN LEVEL: Acetaminophen (Tylenol), Serum: <10 ug/mL — ABNORMAL LOW (ref 10–30)

## 2019-12-03 LAB — ETHANOL: Alcohol, Ethyl (B): <10 mg/dL (ref <10)

## 2019-12-03 LAB — SALICYLATE LEVEL: Salicylate Lvl: <7 mg/dL — ABNORMAL LOW (ref 7.0–30.0)

## 2019-12-03 MED ORDER — IOHEXOL 300 MG/ML  SOLN
100.0000 mL | Freq: Once | INTRAMUSCULAR | Status: AC | PRN
  Administered 2019-12-03: 100 mL via INTRAVENOUS

## 2019-12-03 MED ORDER — KETOROLAC TROMETHAMINE 15 MG/ML IJ SOLN
15.0000 mg | Freq: Once | INTRAMUSCULAR | Status: AC
  Administered 2019-12-03: 15 mg via INTRAVENOUS
  Filled 2019-12-03: qty 1

## 2019-12-03 MED ORDER — SODIUM CHLORIDE 0.9 % IV BOLUS
1000.0000 mL | Freq: Once | INTRAVENOUS | Status: AC
  Administered 2019-12-03: 17:00:00 1000 mL via INTRAVENOUS

## 2019-12-03 MED ORDER — ONDANSETRON 4 MG PO TBDP: 4.0000 mg | ORAL_TABLET | Freq: Three times a day (TID) | ORAL | 0 refills | Status: DC | PRN

## 2019-12-03 MED ORDER — AMOXICILLIN-POT CLAVULANATE 875-125 MG PO TABS: 1.0000 | ORAL_TABLET | Freq: Two times a day (BID) | ORAL | 0 refills | Status: AC
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
## 2019-12-03 NOTE — Discharge Instructions (Signed)
Recommend refraining from any illicit substances, any recreational drugs.  Recommend taking the prescribed antibiotics for your possible diverticulitis.  If you develop worsening pain, fever, vomiting, return to ER for reassessment.  Can take Zofran, Tylenol and Motrin as needed for pain and nausea.
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
## 2022-05-08 ENCOUNTER — Emergency Department (HOSPITAL_COMMUNITY): Payer: Self-pay | Admitting: Certified Registered"

## 2022-05-08 ENCOUNTER — Ambulatory Visit (HOSPITAL_COMMUNITY)
Admission: EM | Admit: 2022-05-08 | Discharge: 2022-05-08 | Disposition: A | Discharge status: Home / Self Care | Payer: Self-pay | Attending: Orthopedic Surgery | Admitting: Orthopedic Surgery

## 2022-05-08 ENCOUNTER — Encounter (HOSPITAL_COMMUNITY)
Admission: EM | Disposition: A | Discharge status: Home / Self Care | Payer: Self-pay | Source: Home / Self Care | Attending: Emergency Medicine

## 2022-05-08 ENCOUNTER — Emergency Department (HOSPITAL_BASED_OUTPATIENT_CLINIC_OR_DEPARTMENT_OTHER): Payer: Self-pay | Admitting: Certified Registered"

## 2022-05-08 ENCOUNTER — Emergency Department (HOSPITAL_COMMUNITY): Payer: Self-pay

## 2022-05-08 ENCOUNTER — Encounter (HOSPITAL_COMMUNITY): Payer: Self-pay | Admitting: *Deleted

## 2022-05-08 ENCOUNTER — Other Ambulatory Visit: Payer: Self-pay

## 2022-05-08 DIAGNOSIS — W3189XA Contact with other specified machinery, initial encounter: Secondary | ICD-10-CM | POA: Insufficient documentation

## 2022-05-08 DIAGNOSIS — S62619B Displaced fracture of proximal phalanx of unspecified finger, initial encounter for open fracture: Secondary | ICD-10-CM

## 2022-05-08 DIAGNOSIS — S6992XA Unspecified injury of left wrist, hand and finger(s), initial encounter: Secondary | ICD-10-CM

## 2022-05-08 DIAGNOSIS — Z87891 Personal history of nicotine dependence: Secondary | ICD-10-CM | POA: Insufficient documentation

## 2022-05-08 DIAGNOSIS — S61412A Laceration without foreign body of left hand, initial encounter: Secondary | ICD-10-CM

## 2022-05-08 DIAGNOSIS — S62611B Displaced fracture of proximal phalanx of left index finger, initial encounter for open fracture: Secondary | ICD-10-CM | POA: Insufficient documentation

## 2022-05-08 DIAGNOSIS — Y9389 Activity, other specified: Secondary | ICD-10-CM | POA: Insufficient documentation

## 2022-05-08 DIAGNOSIS — S62623B Displaced fracture of medial phalanx of left middle finger, initial encounter for open fracture: Secondary | ICD-10-CM | POA: Insufficient documentation

## 2022-05-08 DIAGNOSIS — S62621B Displaced fracture of medial phalanx of left index finger, initial encounter for open fracture: Secondary | ICD-10-CM | POA: Insufficient documentation

## 2022-05-08 DIAGNOSIS — S66323A Laceration of extensor muscle, fascia and tendon of left middle finger at wrist and hand level, initial encounter: Secondary | ICD-10-CM | POA: Insufficient documentation

## 2022-05-08 DIAGNOSIS — S66321A Laceration of extensor muscle, fascia and tendon of left index finger at wrist and hand level, initial encounter: Secondary | ICD-10-CM | POA: Insufficient documentation

## 2022-05-08 DIAGNOSIS — S62613B Displaced fracture of proximal phalanx of left middle finger, initial encounter for open fracture: Secondary | ICD-10-CM | POA: Insufficient documentation

## 2022-05-08 HISTORY — PX: I & D EXTREMITY: SHX5045

## 2022-05-08 LAB — CBC
HCT: 44.6 % (ref 39.0–52.0)
Hemoglobin: 14.9 g/dL (ref 13.0–17.0)
MCH: 29.7 pg (ref 26.0–34.0)
MCHC: 33.4 g/dL (ref 30.0–36.0)
MCV: 88.8 fL (ref 80.0–100.0)
Platelets: 257 10*3/uL (ref 150–400)
RBC: 5.02 MIL/uL (ref 4.22–5.81)
RDW: 12.8 % (ref 11.5–15.5)
WBC: 7.5 10*3/uL (ref 4.0–10.5)
nRBC: 0 % (ref 0.0–0.2)

## 2022-05-08 LAB — BASIC METABOLIC PANEL
Anion gap: 9 (ref 5–15)
BUN: 17 mg/dL (ref 6–20)
CO2: 25 mmol/L (ref 22–32)
Calcium: 9.4 mg/dL (ref 8.9–10.3)
Chloride: 106 mmol/L (ref 98–111)
Creatinine, Ser: 1.04 mg/dL (ref 0.61–1.24)
GFR, Estimated: >60 mL/min (ref >60)
Glucose, Bld: 115 mg/dL — ABNORMAL HIGH (ref 70–99)
Potassium: 4.7 mmol/L (ref 3.5–5.1)
Sodium: 140 mmol/L (ref 135–145)

## 2022-05-08 LAB — SURGICAL PCR SCREEN
MRSA, PCR: NEGATIVE
Staphylococcus aureus: NEGATIVE

## 2022-05-08 SURGERY — IRRIGATION AND DEBRIDEMENT EXTREMITY
Anesthesia: General | Site: Hand | Laterality: Left

## 2022-05-08 SURGERY — IRRIGATION AND DEBRIDEMENT EXTREMITY
Anesthesia: General | Laterality: Left

## 2022-05-08 MED ORDER — CEFAZOLIN SODIUM-DEXTROSE 2-4 GM/100ML-% IV SOLN
INTRAVENOUS | Status: AC
  Filled 2022-05-08: qty 100

## 2022-05-08 MED ORDER — ESMOLOL HCL 100 MG/10ML IV SOLN
INTRAVENOUS | Status: DC | PRN
  Administered 2022-05-08: 40 mg via INTRAVENOUS

## 2022-05-08 MED ORDER — OXYCODONE-ACETAMINOPHEN 10-325 MG PO TABS: 1.0000 | ORAL_TABLET | Freq: Four times a day (QID) | ORAL | 0 refills | Status: DC | PRN

## 2022-05-08 MED ORDER — CHLORHEXIDINE GLUCONATE 0.12 % MT SOLN: 15.0000 mL | Freq: Once | OROMUCOSAL | Status: AC

## 2022-05-08 MED ORDER — CHLORHEXIDINE GLUCONATE 4 % EX LIQD: 60.0000 mL | Freq: Once | CUTANEOUS | Status: DC

## 2022-05-08 MED ORDER — SUGAMMADEX SODIUM 200 MG/2ML IV SOLN
INTRAVENOUS | Status: DC | PRN
  Administered 2022-05-08: 200 mg via INTRAVENOUS

## 2022-05-08 MED ORDER — SODIUM CHLORIDE 0.9 % IR SOLN: Status: DC | PRN

## 2022-05-08 MED ORDER — BUPIVACAINE HCL (PF) 0.25 % IJ SOLN
INTRAMUSCULAR | Status: DC | PRN
  Administered 2022-05-08: 8 mL

## 2022-05-08 MED ORDER — FENTANYL CITRATE (PF) 250 MCG/5ML IJ SOLN
INTRAMUSCULAR | Status: DC | PRN
  Administered 2022-05-08: 100 ug via INTRAVENOUS
  Administered 2022-05-08: 50 ug via INTRAVENOUS
  Administered 2022-05-08: 100 ug via INTRAVENOUS

## 2022-05-08 MED ORDER — ROCURONIUM BROMIDE 10 MG/ML (PF) SYRINGE
PREFILLED_SYRINGE | INTRAVENOUS | Status: DC | PRN
  Administered 2022-05-08: 50 mg via INTRAVENOUS

## 2022-05-08 MED ORDER — CHLORHEXIDINE GLUCONATE 0.12 % MT SOLN
OROMUCOSAL | Status: AC
  Administered 2022-05-08: 15 mL via OROMUCOSAL
  Filled 2022-05-08: qty 15

## 2022-05-08 MED ORDER — FENTANYL CITRATE (PF) 100 MCG/2ML IJ SOLN: 25.0000 ug | INTRAMUSCULAR | Status: DC | PRN

## 2022-05-08 MED ORDER — HYDROMORPHONE HCL 1 MG/ML IJ SOLN
0.5000 mg | Freq: Once | INTRAMUSCULAR | Status: AC
  Administered 2022-05-08: 0.5 mg via INTRAVENOUS
  Filled 2022-05-08: qty 1

## 2022-05-08 MED ORDER — FENTANYL CITRATE (PF) 250 MCG/5ML IJ SOLN
INTRAMUSCULAR | Status: AC
  Filled 2022-05-08: qty 5

## 2022-05-08 MED ORDER — CEPHALEXIN 500 MG PO CAPS: 500.0000 mg | ORAL_CAPSULE | Freq: Four times a day (QID) | ORAL | 0 refills | Status: AC

## 2022-05-08 MED ORDER — PROPOFOL 10 MG/ML IV BOLUS
INTRAVENOUS | Status: AC
  Filled 2022-05-08: qty 20

## 2022-05-08 MED ORDER — LACTATED RINGERS IV SOLN: INTRAVENOUS | Status: DC

## 2022-05-08 MED ORDER — PROPOFOL 10 MG/ML IV BOLUS
INTRAVENOUS | Status: DC | PRN
  Administered 2022-05-08: 170 mg via INTRAVENOUS

## 2022-05-08 MED ORDER — CEFAZOLIN SODIUM-DEXTROSE 2-4 GM/100ML-% IV SOLN
2.0000 g | INTRAVENOUS | Status: AC
  Administered 2022-05-08: 2 g via INTRAVENOUS
  Filled 2022-05-08: qty 100

## 2022-05-08 MED ORDER — ORAL CARE MOUTH RINSE: 15.0000 mL | Freq: Once | OROMUCOSAL | Status: AC

## 2022-05-08 MED ORDER — LIDOCAINE 2% (20 MG/ML) 5 ML SYRINGE
INTRAMUSCULAR | Status: DC | PRN
  Administered 2022-05-08: 100 mg via INTRAVENOUS

## 2022-05-08 MED ORDER — 0.9 % SODIUM CHLORIDE (POUR BTL) OPTIME
TOPICAL | Status: DC | PRN
  Administered 2022-05-08: 1000 mL

## 2022-05-08 MED ORDER — BUPIVACAINE HCL (PF) 0.25 % IJ SOLN
INTRAMUSCULAR | Status: AC
  Filled 2022-05-08: qty 30

## 2022-05-08 MED ORDER — PROMETHAZINE HCL 25 MG/ML IJ SOLN: 6.2500 mg | INTRAMUSCULAR | Status: DC | PRN

## 2022-05-08 MED ORDER — POVIDONE-IODINE 10 % EX SWAB
2.0000 | Freq: Once | CUTANEOUS | Status: AC
  Administered 2022-05-08: 2 via TOPICAL

## 2022-05-08 MED ORDER — SUCCINYLCHOLINE CHLORIDE 200 MG/10ML IV SOSY
PREFILLED_SYRINGE | INTRAVENOUS | Status: DC | PRN
  Administered 2022-05-08: 100 mg via INTRAVENOUS

## 2022-05-08 MED ORDER — CEFAZOLIN SODIUM-DEXTROSE 2-4 GM/100ML-% IV SOLN: 2.0000 g | INTRAVENOUS | Status: DC

## 2022-05-08 SURGICAL SUPPLY — 79 items
ANCHOR JUGGERKNOT 1.0 1DR 2-0 (Anchor) IMPLANT
BAG COUNTER SPONGE SURGICOUNT (BAG) ×1 IMPLANT
BIT DRILL 1.0 W/MINI QC (BIT) IMPLANT
BIT DRILL 1.1 MINI QC NONSTRL (BIT) IMPLANT
BIT DRILL 1.3 (BIT) ×1
BIT DRILL 1.3XMNQK CNCT DISP (BIT) IMPLANT
BIT DRL 1.3XMNQK CNCT DISP (BIT) ×1
BNDG COHESIVE 1X5 TAN STRL LF (GAUZE/BANDAGES/DRESSINGS) IMPLANT
BNDG CONFORM 2 STRL LF (GAUZE/BANDAGES/DRESSINGS) IMPLANT
BNDG ELASTIC 2X5.8 VLCR STR LF (GAUZE/BANDAGES/DRESSINGS) IMPLANT
BNDG ELASTIC 3X5.8 VLCR STR LF (GAUZE/BANDAGES/DRESSINGS) ×1 IMPLANT
BNDG ELASTIC 4X5.8 VLCR STR LF (GAUZE/BANDAGES/DRESSINGS) ×1 IMPLANT
BNDG ESMARK 4X9 LF (GAUZE/BANDAGES/DRESSINGS) ×1 IMPLANT
BNDG GAUZE DERMACEA FLUFF 4 (GAUZE/BANDAGES/DRESSINGS) ×1 IMPLANT
CORD BIPOLAR FORCEPS 12FT (ELECTRODE) ×1 IMPLANT
COVER SURGICAL LIGHT HANDLE (MISCELLANEOUS) ×1 IMPLANT
CUFF TOURN SGL QUICK 18X4 (TOURNIQUET CUFF) ×1 IMPLANT
CUFF TOURN SGL QUICK 24 (TOURNIQUET CUFF)
CUFF TRNQT CYL 24X4X16.5-23 (TOURNIQUET CUFF) IMPLANT
DRAIN PENROSE 1/4X12 LTX STRL (WOUND CARE) IMPLANT
DRAPE OEC MINIVIEW 54X84 (DRAPES) IMPLANT
DRAPE SURG 17X23 STRL (DRAPES) ×1 IMPLANT
DRSG ADAPTIC 3X8 NADH LF (GAUZE/BANDAGES/DRESSINGS) ×1 IMPLANT
ELECT REM PT RETURN 9FT ADLT (ELECTROSURGICAL)
ELECTRODE REM PT RTRN 9FT ADLT (ELECTROSURGICAL) IMPLANT
GAUZE SPONGE 4X4 12PLY STRL (GAUZE/BANDAGES/DRESSINGS) ×1 IMPLANT
GAUZE XEROFORM 1X8 LF (GAUZE/BANDAGES/DRESSINGS) ×1 IMPLANT
GAUZE XEROFORM 5X9 LF (GAUZE/BANDAGES/DRESSINGS) IMPLANT
GLOVE BIO SURGEON STRL SZ 6.5 (GLOVE) IMPLANT
GLOVE BIOGEL PI IND STRL 6.5 (GLOVE) IMPLANT
GLOVE BIOGEL PI IND STRL 7.5 (GLOVE) IMPLANT
GLOVE BIOGEL PI IND STRL 8.5 (GLOVE) ×1 IMPLANT
GLOVE ECLIPSE 7.5 STRL STRAW (GLOVE) IMPLANT
GLOVE INDICATOR 7.5 STRL GRN (GLOVE) IMPLANT
GLOVE SURG ORTHO 8.0 STRL STRW (GLOVE) ×1 IMPLANT
GOWN STRL REUS W/ TWL LRG LVL3 (GOWN DISPOSABLE) ×3 IMPLANT
GOWN STRL REUS W/ TWL XL LVL3 (GOWN DISPOSABLE) ×1 IMPLANT
GOWN STRL REUS W/TWL LRG LVL3 (GOWN DISPOSABLE) ×3
GOWN STRL REUS W/TWL XL LVL3 (GOWN DISPOSABLE) ×1
HANDPIECE INTERPULSE COAX TIP (DISPOSABLE)
K-WIRE .045X6 DBL TRO NS (WIRE) ×1
K-WIRE 1.1 (WIRE) ×2
K-WIRE FX150X1.1XTROC TIP (WIRE) ×2
KIT BASIN OR (CUSTOM PROCEDURE TRAY) ×1 IMPLANT
KIT TURNOVER KIT B (KITS) ×1 IMPLANT
KWIRE .045X6 DBL TRO NS (WIRE) IMPLANT
KWIRE FX150X1.1XTROC TIP (WIRE) IMPLANT
MANIFOLD NEPTUNE II (INSTRUMENTS) ×1 IMPLANT
NDL HYPO 25GX1X1/2 BEV (NEEDLE) IMPLANT
NEEDLE HYPO 25GX1X1/2 BEV (NEEDLE) ×1 IMPLANT
NS IRRIG 1000ML POUR BTL (IV SOLUTION) ×1 IMPLANT
PACK ORTHO EXTREMITY (CUSTOM PROCEDURE TRAY) ×1 IMPLANT
PAD ARMBOARD 7.5X6 YLW CONV (MISCELLANEOUS) ×2 IMPLANT
PAD CAST 3X4 CTTN HI CHSV (CAST SUPPLIES) IMPLANT
PAD CAST 4YDX4 CTTN HI CHSV (CAST SUPPLIES) ×1 IMPLANT
PADDING CAST COTTON 2X4 ST (MISCELLANEOUS) IMPLANT
PADDING CAST COTTON 3X4 STRL (CAST SUPPLIES) ×1
PADDING CAST COTTON 4X4 STRL (CAST SUPPLIES) ×1
SCREW NON-LOCK 1.3X15 (Screw) IMPLANT
SET CYSTO W/LG BORE CLAMP LF (SET/KITS/TRAYS/PACK) IMPLANT
SET HNDPC FAN SPRY TIP SCT (DISPOSABLE) IMPLANT
SOAP 2 % CHG 4 OZ (WOUND CARE) ×1 IMPLANT
SPONGE T-LAP 18X18 ~~LOC~~+RFID (SPONGE) ×1 IMPLANT
SPONGE T-LAP 4X18 ~~LOC~~+RFID (SPONGE) ×1 IMPLANT
SUT ETHIBOND 4 0 TF (SUTURE) IMPLANT
SUT ETHILON 4 0 PS 2 18 (SUTURE) IMPLANT
SUT ETHILON 5 0 P 3 18 (SUTURE)
SUT NYLON ETHILON 5-0 P-3 1X18 (SUTURE) IMPLANT
SUT PROLENE 4 0 PS 2 18 (SUTURE) IMPLANT
SUT PROLENE 4 0 TF (SUTURE) IMPLANT
SWAB COLLECTION DEVICE MRSA (MISCELLANEOUS) ×1 IMPLANT
SWAB CULTURE ESWAB REG 1ML (MISCELLANEOUS) IMPLANT
SYR CONTROL 10ML LL (SYRINGE) IMPLANT
TOWEL GREEN STERILE (TOWEL DISPOSABLE) ×1 IMPLANT
TOWEL GREEN STERILE FF (TOWEL DISPOSABLE) ×1 IMPLANT
TUBE CONNECTING 12X1/4 (SUCTIONS) ×1 IMPLANT
UNDERPAD 30X36 HEAVY ABSORB (UNDERPADS AND DIAPERS) ×1 IMPLANT
WATER STERILE IRR 1000ML POUR (IV SOLUTION) ×1 IMPLANT
YANKAUER SUCT BULB TIP NO VENT (SUCTIONS) ×1 IMPLANT
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
## 2022-05-08 NOTE — Discharge Instructions (Signed)
KEEP BANDAGE CLEAN AND DRY °CALL OFFICE FOR F/U APPT [phone] IN 8 DAYS °KEEP HAND ELEVATED ABOVE HEART °OK TO APPLY ICE TO OPERATIVE AREA °CONTACT OFFICE IF ANY WORSENING PAIN OR CONCERNS. °

## 2022-05-08 NOTE — Anesthesia Preprocedure Evaluation (Signed)
Anesthesia Evaluation  Patient identified by MRN, date of birth, ID band Patient awake    Reviewed: Allergy & Precautions, NPO status , Patient's Chart, lab work & pertinent test results  Airway Mallampati: II  TM Distance: >3 FB Neck ROM: Full    Dental  (+) Dental Advisory Given, Poor Dentition, Chipped   Pulmonary Patient abstained from smoking., former smoker,    Pulmonary exam normal breath sounds clear to auscultation       Cardiovascular negative cardio ROS Normal cardiovascular exam Rhythm:Regular Rate:Normal     Neuro/Psych negative neurological ROS     GI/Hepatic Neg liver ROS, GERD  Medicated,  Endo/Other  diabetes, Type 2  Renal/GU negative Renal ROS     Musculoskeletal Left Hand Laceration   Abdominal   Peds  Hematology negative hematology ROS (+)   Anesthesia Other Findings Day of surgery medications reviewed with the patient.  Reproductive/Obstetrics                             Anesthesia Physical Anesthesia Plan  ASA: 2  Anesthesia Plan: General   Post-op Pain Management: Ofirmev IV (intra-op)*   Induction: Intravenous, Rapid sequence and Cricoid pressure planned  PONV Risk Score and Plan: 2 and Dexamethasone and Ondansetron  Airway Management Planned: Oral ETT  Additional Equipment:   Intra-op Plan:   Post-operative Plan: Extubation in OR  Informed Consent: I have reviewed the patients History and Physical, chart, labs and discussed the procedure including the risks, benefits and alternatives for the proposed anesthesia with the patient or authorized representative who has indicated his/her understanding and acceptance.     Dental advisory given  Plan Discussed with: CRNA  Anesthesia Plan Comments:         Anesthesia Quick Evaluation
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
## 2022-05-08 NOTE — ED Notes (Signed)
Report called to short stay. 

## 2022-05-08 NOTE — Transfer of Care (Signed)
Immediate Anesthesia Transfer of Care Note  Patient: Seth Grant  Procedure(s) Performed: IRRIGATION AND DEBRIDEMENT AND PERCUTANEOUS PINNING OF LEFT INDEX AND MIDDLE FINGER (Left: Hand)  Patient Location: PACU  Anesthesia Type:General  Level of Consciousness: drowsy and patient cooperative  Airway & Oxygen Therapy: Patient Spontanous Breathing  Post-op Assessment: Report given to RN and Post -op Vital signs reviewed and stable  Post vital signs: Reviewed and stable  Last Vitals:  Vitals Value Taken Time  BP 140/88 05/08/22 1630  Temp 36.4 C 05/08/22 1627  Pulse 71 05/08/22 1630  Resp 14 05/08/22 1630  SpO2 93 % 05/08/22 1630  Vitals shown include unvalidated device data.  Last Pain:  Vitals:   05/08/22 1312  TempSrc:   PainSc: 5          Complications: No notable events documented.

## 2022-05-08 NOTE — Anesthesia Procedure Notes (Signed)
Procedure Name: Intubation Date/Time: 05/08/2022 2:21 PM  Performed by: Georgia Duff, CRNAPre-anesthesia Checklist: Patient identified, Emergency Drugs available, Suction available and Patient being monitored Patient Re-evaluated:Patient Re-evaluated prior to induction Oxygen Delivery Method: Circle System Utilized Preoxygenation: Pre-oxygenation with 100% oxygen Induction Type: IV induction Ventilation: Mask ventilation without difficulty Laryngoscope Size: Miller and 2 Grade View: Grade I Tube type: Oral Tube size: 7.5 mm Number of attempts: 1 Airway Equipment and Method: Stylet and Oral airway Placement Confirmation: ETT inserted through vocal cords under direct vision, positive ETCO2 and breath sounds checked- equal and bilateral Secured at: 22 cm Tube secured with: Tape Dental Injury: Teeth and Oropharynx as per pre-operative assessment

## 2022-05-09 ENCOUNTER — Encounter (HOSPITAL_COMMUNITY): Payer: Self-pay | Admitting: Orthopedic Surgery

## 2022-05-09 NOTE — Anesthesia Postprocedure Evaluation (Signed)
Anesthesia Post Note  Patient: Seth Grant  Procedure(s) Performed: IRRIGATION AND DEBRIDEMENT AND PERCUTANEOUS PINNING OF LEFT INDEX AND MIDDLE FINGER (Left: Hand)     Patient location during evaluation: PACU Anesthesia Type: General Level of consciousness: awake and alert Pain management: pain level controlled Vital Signs Assessment: post-procedure vital signs reviewed and stable Respiratory status: spontaneous breathing, nonlabored ventilation, respiratory function stable and patient connected to nasal cannula oxygen Cardiovascular status: blood pressure returned to baseline and stable Postop Assessment: no apparent nausea or vomiting Anesthetic complications: no   No notable events documented.  Last Vitals:  Vitals:   05/08/22 1715 05/08/22 1730  BP: 138/88 (!) 136/93  Pulse: 73 69  Resp: 15 17  Temp:  (!) 36.4 C  SpO2: 94% 94%    Last Pain:  Vitals:   05/08/22 1730  TempSrc:   PainSc: Asleep                 Lilit Cinelli,Craven S
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
## 2022-12-26 ENCOUNTER — Encounter (HOSPITAL_COMMUNITY): Payer: Self-pay

## 2022-12-26 ENCOUNTER — Ambulatory Visit (HOSPITAL_COMMUNITY)
Admission: EM | Admit: 2022-12-26 | Discharge: 2022-12-26 | Disposition: A | Discharge status: Home / Self Care | Payer: Medicaid Other | Attending: Family Medicine | Admitting: Family Medicine

## 2022-12-26 DIAGNOSIS — R600 Localized edema: Secondary | ICD-10-CM | POA: Diagnosis not present

## 2022-12-26 MED ORDER — POTASSIUM CHLORIDE CRYS ER 20 MEQ PO TBCR: 20.0000 meq | EXTENDED_RELEASE_TABLET | Freq: Every day | ORAL | 0 refills | Status: DC

## 2022-12-26 MED ORDER — FUROSEMIDE 20 MG PO TABS: 20.0000 mg | ORAL_TABLET | Freq: Every day | ORAL | 0 refills | Status: DC

## 2022-12-26 NOTE — ED Notes (Signed)
Unable to obtain full blood samples x multiple attempts x3 RNs. Provider notified.
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
## 2022-12-26 NOTE — Discharge Instructions (Signed)
Take furosemide 20 mg--1 daily for 3 days.  This is a diuretic to get fluid off  Take potassium chloride 20 mill equivalents--1 tablet daily for 3 days.  The furosemide can make your kidneys lose potassium, so this is to make up for that potassium loss  We have drawn blood to check your blood counts and your sodium and potassium and kidney function numbers.  Staff will let you know if anything is significantly abnormal  Please try to sleep in a position with your knees extended or straight and your feet more elevated.  I think sleeping with your knees bent and your feet down is contributing to the situation.

## 2022-12-26 NOTE — ED Triage Notes (Signed)
Pt states bilateral lower extremity swelling for the past month.

## 2022-12-27 ENCOUNTER — Telehealth (HOSPITAL_COMMUNITY): Payer: Self-pay

## 2022-12-27 ENCOUNTER — Telehealth (HOSPITAL_COMMUNITY): Payer: Self-pay | Admitting: Emergency Medicine

## 2023-01-06 ENCOUNTER — Ambulatory Visit (HOSPITAL_COMMUNITY): Payer: Medicaid Other

## 2023-01-12 ENCOUNTER — Inpatient Hospital Stay (HOSPITAL_COMMUNITY)
Admission: EM | Admit: 2023-01-12 | Discharge: 2023-01-15 | DRG: 194 | Disposition: A | Discharge status: Home / Self Care | Payer: Medicaid Other | Attending: Internal Medicine | Admitting: Internal Medicine

## 2023-01-12 ENCOUNTER — Emergency Department (HOSPITAL_COMMUNITY): Payer: Medicaid Other

## 2023-01-12 ENCOUNTER — Other Ambulatory Visit: Payer: Self-pay

## 2023-01-12 ENCOUNTER — Encounter (HOSPITAL_COMMUNITY): Payer: Self-pay

## 2023-01-12 DIAGNOSIS — R600 Localized edema: Secondary | ICD-10-CM | POA: Diagnosis present

## 2023-01-12 DIAGNOSIS — A419 Sepsis, unspecified organism: Secondary | ICD-10-CM | POA: Insufficient documentation

## 2023-01-12 DIAGNOSIS — J189 Pneumonia, unspecified organism: Secondary | ICD-10-CM | POA: Insufficient documentation

## 2023-01-12 DIAGNOSIS — E872 Acidosis, unspecified: Secondary | ICD-10-CM | POA: Diagnosis present

## 2023-01-12 DIAGNOSIS — J209 Acute bronchitis, unspecified: Secondary | ICD-10-CM | POA: Diagnosis present

## 2023-01-12 DIAGNOSIS — Z79890 Hormone replacement therapy (postmenopausal): Secondary | ICD-10-CM

## 2023-01-12 DIAGNOSIS — F1590 Other stimulant use, unspecified, uncomplicated: Secondary | ICD-10-CM | POA: Diagnosis present

## 2023-01-12 DIAGNOSIS — J122 Parainfluenza virus pneumonia: Principal | ICD-10-CM | POA: Diagnosis present

## 2023-01-12 DIAGNOSIS — Z5902 Unsheltered homelessness: Secondary | ICD-10-CM

## 2023-01-12 DIAGNOSIS — Z1152 Encounter for screening for COVID-19: Secondary | ICD-10-CM

## 2023-01-12 DIAGNOSIS — Z833 Family history of diabetes mellitus: Secondary | ICD-10-CM

## 2023-01-12 DIAGNOSIS — Z6831 Body mass index (BMI) 31.0-31.9, adult: Secondary | ICD-10-CM

## 2023-01-12 DIAGNOSIS — Z8639 Personal history of other endocrine, nutritional and metabolic disease: Secondary | ICD-10-CM

## 2023-01-12 DIAGNOSIS — E7800 Pure hypercholesterolemia, unspecified: Secondary | ICD-10-CM | POA: Diagnosis present

## 2023-01-12 DIAGNOSIS — E876 Hypokalemia: Secondary | ICD-10-CM | POA: Diagnosis present

## 2023-01-12 DIAGNOSIS — Z825 Family history of asthma and other chronic lower respiratory diseases: Secondary | ICD-10-CM

## 2023-01-12 DIAGNOSIS — Z720 Tobacco use: Secondary | ICD-10-CM

## 2023-01-12 DIAGNOSIS — E871 Hypo-osmolality and hyponatremia: Secondary | ICD-10-CM | POA: Diagnosis present

## 2023-01-12 DIAGNOSIS — E669 Obesity, unspecified: Secondary | ICD-10-CM | POA: Diagnosis present

## 2023-01-12 DIAGNOSIS — Z79899 Other long term (current) drug therapy: Secondary | ICD-10-CM

## 2023-01-12 LAB — CBC WITH DIFFERENTIAL/PLATELET
Abs Immature Granulocytes: 0.02 10*3/uL (ref 0.00–0.07)
Basophils Absolute: 0 10*3/uL (ref 0.0–0.1)
Basophils Relative: 0 %
Eosinophils Absolute: 0.2 10*3/uL (ref 0.0–0.5)
Eosinophils Relative: 3 %
HCT: 45.6 % (ref 39.0–52.0)
Hemoglobin: 14.9 g/dL (ref 13.0–17.0)
Immature Granulocytes: 0 %
Lymphocytes Relative: 25 %
Lymphs Abs: 2.2 10*3/uL (ref 0.7–4.0)
MCH: 29 pg (ref 26.0–34.0)
MCHC: 32.7 g/dL (ref 30.0–36.0)
MCV: 88.7 fL (ref 80.0–100.0)
Monocytes Absolute: 0.9 10*3/uL (ref 0.1–1.0)
Monocytes Relative: 10 %
Neutro Abs: 5.7 10*3/uL (ref 1.7–7.7)
Neutrophils Relative %: 62 %
Platelets: 236 10*3/uL (ref 150–400)
RBC: 5.14 MIL/uL (ref 4.22–5.81)
RDW: 12.7 % (ref 11.5–15.5)
WBC: 9 10*3/uL (ref 4.0–10.5)
nRBC: 0 % (ref 0.0–0.2)

## 2023-01-12 LAB — APTT: aPTT: 32 seconds (ref 24–36)

## 2023-01-12 LAB — COMPREHENSIVE METABOLIC PANEL
ALT: 20 U/L (ref 0–44)
AST: 23 U/L (ref 15–41)
Albumin: 3.5 g/dL (ref 3.5–5.0)
Alkaline Phosphatase: 63 U/L (ref 38–126)
Anion gap: 10 (ref 5–15)
BUN: 9 mg/dL (ref 6–20)
CO2: 22 mmol/L (ref 22–32)
Calcium: 9 mg/dL (ref 8.9–10.3)
Chloride: 100 mmol/L (ref 98–111)
Creatinine, Ser: 1.19 mg/dL (ref 0.61–1.24)
GFR, Estimated: >60 mL/min (ref >60)
Glucose, Bld: 140 mg/dL — ABNORMAL HIGH (ref 70–99)
Potassium: 3.3 mmol/L — ABNORMAL LOW (ref 3.5–5.1)
Sodium: 132 mmol/L — ABNORMAL LOW (ref 135–145)
Total Bilirubin: 0.8 mg/dL (ref 0.3–1.2)
Total Protein: 6.9 g/dL (ref 6.5–8.1)

## 2023-01-12 LAB — RESP PANEL BY RT-PCR (RSV, FLU A&B, COVID)  RVPGX2
Influenza A by PCR: NEGATIVE
Influenza B by PCR: NEGATIVE
Resp Syncytial Virus by PCR: NEGATIVE
SARS Coronavirus 2 by RT PCR: NEGATIVE

## 2023-01-12 LAB — PROTIME-INR
INR: 1 (ref 0.8–1.2)
Prothrombin Time: 13.5 seconds (ref 11.4–15.2)

## 2023-01-12 LAB — BRAIN NATRIURETIC PEPTIDE: B Natriuretic Peptide: 4.3 pg/mL (ref 0.0–100.0)

## 2023-01-12 LAB — TROPONIN I (HIGH SENSITIVITY): Troponin I (High Sensitivity): 4 ng/L (ref <18)

## 2023-01-12 LAB — LACTIC ACID, PLASMA: Lactic Acid, Venous: 2.5 mmol/L (ref 0.5–1.9)

## 2023-01-12 MED ORDER — IPRATROPIUM-ALBUTEROL 0.5-2.5 (3) MG/3ML IN SOLN
3.0000 mL | Freq: Once | RESPIRATORY_TRACT | Status: AC
  Administered 2023-01-12: 3 mL via RESPIRATORY_TRACT
  Filled 2023-01-12: qty 3

## 2023-01-12 MED ORDER — ALBUTEROL SULFATE (2.5 MG/3ML) 0.083% IN NEBU
2.5000 mg | INHALATION_SOLUTION | Freq: Once | RESPIRATORY_TRACT | Status: AC
  Administered 2023-01-12: 2.5 mg via RESPIRATORY_TRACT
  Filled 2023-01-12: qty 3

## 2023-01-12 MED ORDER — SODIUM CHLORIDE 0.9 % IV BOLUS
1000.0000 mL | Freq: Once | INTRAVENOUS | Status: AC
  Administered 2023-01-12: 1000 mL via INTRAVENOUS

## 2023-01-12 MED ORDER — POTASSIUM CHLORIDE CRYS ER 20 MEQ PO TBCR
40.0000 meq | EXTENDED_RELEASE_TABLET | Freq: Once | ORAL | Status: AC
  Administered 2023-01-12: 40 meq via ORAL
  Filled 2023-01-12: qty 2

## 2023-01-12 MED ORDER — IOHEXOL 350 MG/ML SOLN
75.0000 mL | Freq: Once | INTRAVENOUS | Status: AC | PRN
  Administered 2023-01-12: 75 mL via INTRAVENOUS

## 2023-01-12 MED ORDER — IBUPROFEN 800 MG PO TABS
800.0000 mg | ORAL_TABLET | Freq: Once | ORAL | Status: AC
  Administered 2023-01-12: 800 mg via ORAL
  Filled 2023-01-12: qty 1

## 2023-01-12 MED ORDER — SODIUM CHLORIDE 0.9 % IV BOLUS: 500.0000 mL | Freq: Once | INTRAVENOUS | Status: DC

## 2023-01-12 MED ORDER — IPRATROPIUM-ALBUTEROL 0.5-2.5 (3) MG/3ML IN SOLN
3.0000 mL | Freq: Once | RESPIRATORY_TRACT | Status: AC
  Administered 2023-01-13: 3 mL via RESPIRATORY_TRACT
  Filled 2023-01-12: qty 3

## 2023-01-12 MED ORDER — SODIUM CHLORIDE 0.9 % IV SOLN
500.0000 mg | Freq: Once | INTRAVENOUS | Status: AC
  Administered 2023-01-12: 500 mg via INTRAVENOUS
  Filled 2023-01-12: qty 5

## 2023-01-12 NOTE — ED Triage Notes (Signed)
Pt feeling generally unwell X 1 day. Notes bilateral leg swelling. Pt uses meth daily. Did use today.

## 2023-01-12 NOTE — ED Provider Notes (Signed)
[place] EMERGENCY DEPARTMENT AT [facility] Provider Note   CSN: [id number] Arrival date & time: 01/12/23  2103     History  Chief Complaint  Patient presents with   Fever    Seth Grant is a 47 y.o. male.  HPI 47 year old male presents with cough.  Brought in by EMS with concern for sepsis.  Patient has been feeling bad all day.  He had leg swelling for over a month.  He is also been dealing with bilateral lower leg pain during that time.  However starting today he developed cough that has had bloody sputum.  The blood has decreased throughout the day.  He is also having chest and back and neck pain.  He is also having a sore throat.  He feels like he has a fever and some bodyaches.  He feels short of breath.  He uses meth every day and states he used earlier today.  EMS reported temp of 101.3 and gave 650 mg Tylenol.  Home Medications Prior to Admission medications   Medication Sig Start Date End Date Taking? Authorizing Provider  acetaminophen (TYLENOL) 500 MG tablet Take 1,000 mg by mouth every 6 (six) hours as needed for mild pain.    [provider]  atorvastatin (LIPITOR) 20 MG tablet Take 20 mg by mouth daily.    [provider]  furosemide (LASIX) 20 MG tablet Take 1 tablet (20 mg total) by mouth daily for 3 days. 12/26/22 12/29/22  Zenia Resides, MD  omeprazole (PRILOSEC OTC) 20 MG tablet Take 20 mg by mouth daily.    [provider]  potassium chloride SA (KLOR-CON M) 20 MEQ tablet Take 1 tablet (20 mEq total) by mouth daily for 3 days. 12/26/22 12/29/22  Zenia Resides, MD  TESTOSTERONE IM Inject into the muscle.    [provider]      Allergies    Patient has no known allergies.    Review of Systems   Review of Systems  Constitutional:  Positive for fever.  HENT:  Positive for sore throat.   Respiratory:  Positive for cough and shortness of breath.   Cardiovascular:  Positive for chest pain and leg swelling.   Musculoskeletal:  Positive for myalgias.    Physical Exam Updated Vital Signs BP 132/76 (BP Location: Right Arm)   Pulse 89   Temp (!) 101.2 F (38.4 C) (Axillary)   Resp (!) 22   Ht 6' (1.829 m)   Wt 106.6 kg   SpO2 95%   BMI 31.87 kg/m  Physical Exam Vitals and nursing note reviewed.  Constitutional:      Appearance: He is well-developed.  HENT:     Head: Normocephalic and atraumatic.  Cardiovascular:     Rate and Rhythm: Normal rate and regular rhythm.     Heart sounds: Normal heart sounds.  Pulmonary:     Effort: Pulmonary effort is normal. Tachypnea present. No accessory muscle usage.     Breath sounds: Examination of the left-upper field reveals wheezing. Examination of the left-middle field reveals rales. Examination of the right-lower field reveals rales. Examination of the left-lower field reveals wheezing and rales. Wheezing and rales present.  Abdominal:     General: There is no distension.     Palpations: Abdomen is soft.     Tenderness: There is no abdominal tenderness.  Musculoskeletal:     Right lower leg: Edema present.     Left lower leg: Edema present.  Skin:  General: Skin is warm and dry.  Neurological:     Mental Status: He is alert.     ED Results / Procedures / Treatments   Labs (all labs ordered are listed, but only abnormal results are displayed) Labs Reviewed  LACTIC ACID, PLASMA - Abnormal; Notable for the following components:      Result Value   Lactic Acid, Venous 2.5 (*)    All other components within normal limits  COMPREHENSIVE METABOLIC PANEL - Abnormal; Notable for the following components:   Sodium 132 (*)    Potassium 3.3 (*)    Glucose, Bld 140 (*)    All other components within normal limits  RESP PANEL BY RT-PCR (RSV, FLU A&B, COVID)  [id number]  CULTURE, BLOOD (ROUTINE X 2)  CULTURE, BLOOD (ROUTINE X 2)  PROTIME-INR  APTT  BRAIN NATRIURETIC PEPTIDE  CBC WITH DIFFERENTIAL/PLATELET  LACTIC ACID, PLASMA  CBC WITH  DIFFERENTIAL/PLATELET  URINALYSIS, W/ REFLEX TO CULTURE (INFECTION SUSPECTED)  TROPONIN I (HIGH SENSITIVITY)  TROPONIN I (HIGH SENSITIVITY)    EKG EKG Interpretation  Date/Time:  Friday Jan 12 2023 21:39:40 EDT Ventricular Rate:  98 PR Interval:  130 QRS Duration: 87 QT Interval:  320 QTC Calculation: 409 R Axis:   3 Text Interpretation: Sinus rhythm no acute change compared to 2021 Confirmed by Pricilla Loveless [phone]) on 01/12/2023 10:26:46 PM  Radiology CT Angio Chest PE W and/or Wo Contrast  Result Date: 01/12/2023 CLINICAL DATA:  Pulmonary embolism (PE) suspected, high prob Generally feeling unwell. Daily meth use. EXAM: CT ANGIOGRAPHY CHEST WITH CONTRAST TECHNIQUE: Multidetector CT imaging of the chest was performed using the standard protocol during bolus administration of intravenous contrast. Multiplanar CT image reconstructions and MIPs were obtained to evaluate the vascular anatomy. RADIATION DOSE REDUCTION: This exam was performed according to the departmental dose-optimization program which includes automated exposure control, adjustment of the mA and/or kV according to patient size and/or use of iterative reconstruction technique. CONTRAST:  75mL OMNIPAQUE IOHEXOL 350 MG/ML SOLN COMPARISON:  Radiograph earlier today FINDINGS: Cardiovascular: There are no filling defects within the pulmonary arteries to suggest pulmonary embolus. The thoracic aorta is normal. The heart is upper normal in size. There is no pericardial effusion. Mediastinum/Nodes: Shotty hilar lymph nodes, likely reactive. No mediastinal adenopathy. There are small bilateral axillary nodes, not enlarged by size criteria. No esophageal wall thickening. Lungs/Pleura: Bronchial thickening. Mucoid impaction within the subsegmental bilateral lower lobes. Minor subsegmental atelectasis in the left lower lobe. No confluent consolidation. No pleural fluid. No pulmonary nodule. Upper Abdomen: No acute or unexpected findings.  Musculoskeletal: There are no acute or suspicious osseous abnormalities. No chest wall soft tissue abnormalities Review of the MIP images confirms the above findings. IMPRESSION: 1. No pulmonary embolus. 2. Bronchial thickening with mucous impaction in the subsegmental bilateral lower lobes. Electronically Signed   By: Narda Rutherford M.D.   On: 01/12/2023 23:22   DG Chest 2 View  Result Date: 01/12/2023 CLINICAL DATA:  Feeling generally unwell x1 day. EXAM: CHEST - 2 VIEW COMPARISON:  December 03, 2019 FINDINGS: The heart size and mediastinal contours are within normal limits. Both lungs are clear. The visualized skeletal structures are unremarkable. IMPRESSION: No active cardiopulmonary disease. Electronically Signed   By: Aram Candela M.D.   On: 01/12/2023 22:25    Procedures Procedures    Medications Ordered in ED Medications  azithromycin (ZITHROMAX) 500 mg in sodium chloride 0.9 % 250 mL IVPB (500 mg Intravenous New Bag/Given 01/12/23 2341)  ipratropium-albuterol (DUONEB)  0.5-2.5 (3) MG/3ML nebulizer solution 3 mL (has no administration in time range)  ipratropium-albuterol (DUONEB) 0.5-2.5 (3) MG/3ML nebulizer solution 3 mL (3 mLs Nebulization Given 01/12/23 2238)  albuterol (PROVENTIL) (2.5 MG/3ML) 0.083% nebulizer solution 2.5 mg (2.5 mg Nebulization Given 01/12/23 2238)  potassium chloride SA (KLOR-CON M) CR tablet 40 mEq (40 mEq Oral Given 01/12/23 2334)  iohexol (OMNIPAQUE) 350 MG/ML injection 75 mL (75 mLs Intravenous Contrast Given 01/12/23 2312)  sodium chloride 0.9 % bolus 1,000 mL (1,000 mLs Intravenous New Bag/Given 01/12/23 2333)  ibuprofen (ADVIL) tablet 800 mg (800 mg Oral Given 01/12/23 2338)    ED Course/ Medical Decision Making/ A&P                             Medical Decision Making Amount and/or Complexity of Data Reviewed Labs: ordered.    Details: Lactate 2.5.  Normal WBC.  Mild hypokalemia. Radiology: ordered.    Details: No clear pneumonia on chest x-ray or  CT.  No PE. ECG/medicine tests: ordered and independent interpretation performed.    Details: No ischemia.  Risk Prescription drug management.   Patient likely has acute bronchitis.  He is febrile here though no obvious bacterial infection.  He does have a small lactate of 2.5.  X-ray does not show an obvious pneumonia and CT does not either though there is some mucous plugging.  Will give azithromycin given the wheezing.  He seems a little better after breathing treatment but is still tachypneic.  Borderline low sats around 92.  No history of COPD.  I do not know that he is going to do well by discharge at least not at this point and so will consult for admission. Discussed with Dr. Maryjean Ka.         Final Clinical Impression(s) / ED Diagnoses Final diagnoses:  Acute bronchitis, unspecified organism    Rx / DC Orders ED Discharge Orders     None         Pricilla Loveless, MD 01/12/23 [phone]

## 2023-01-13 ENCOUNTER — Inpatient Hospital Stay (HOSPITAL_COMMUNITY): Payer: Medicaid Other

## 2023-01-13 DIAGNOSIS — J209 Acute bronchitis, unspecified: Secondary | ICD-10-CM

## 2023-01-13 DIAGNOSIS — Z5902 Unsheltered homelessness: Secondary | ICD-10-CM | POA: Diagnosis not present

## 2023-01-13 DIAGNOSIS — J189 Pneumonia, unspecified organism: Secondary | ICD-10-CM | POA: Diagnosis present

## 2023-01-13 DIAGNOSIS — Z825 Family history of asthma and other chronic lower respiratory diseases: Secondary | ICD-10-CM | POA: Diagnosis not present

## 2023-01-13 DIAGNOSIS — R0609 Other forms of dyspnea: Secondary | ICD-10-CM | POA: Diagnosis not present

## 2023-01-13 DIAGNOSIS — E876 Hypokalemia: Secondary | ICD-10-CM | POA: Diagnosis present

## 2023-01-13 DIAGNOSIS — M7989 Other specified soft tissue disorders: Secondary | ICD-10-CM | POA: Diagnosis not present

## 2023-01-13 DIAGNOSIS — Z8639 Personal history of other endocrine, nutritional and metabolic disease: Secondary | ICD-10-CM | POA: Diagnosis not present

## 2023-01-13 DIAGNOSIS — R600 Localized edema: Secondary | ICD-10-CM | POA: Diagnosis present

## 2023-01-13 DIAGNOSIS — Z833 Family history of diabetes mellitus: Secondary | ICD-10-CM | POA: Diagnosis not present

## 2023-01-13 DIAGNOSIS — Z720 Tobacco use: Secondary | ICD-10-CM | POA: Diagnosis not present

## 2023-01-13 DIAGNOSIS — B348 Other viral infections of unspecified site: Secondary | ICD-10-CM | POA: Diagnosis not present

## 2023-01-13 DIAGNOSIS — Z79899 Other long term (current) drug therapy: Secondary | ICD-10-CM | POA: Diagnosis not present

## 2023-01-13 DIAGNOSIS — Z79890 Hormone replacement therapy (postmenopausal): Secondary | ICD-10-CM | POA: Diagnosis not present

## 2023-01-13 DIAGNOSIS — F1590 Other stimulant use, unspecified, uncomplicated: Secondary | ICD-10-CM | POA: Diagnosis present

## 2023-01-13 DIAGNOSIS — J122 Parainfluenza virus pneumonia: Secondary | ICD-10-CM | POA: Diagnosis present

## 2023-01-13 DIAGNOSIS — E669 Obesity, unspecified: Secondary | ICD-10-CM | POA: Diagnosis present

## 2023-01-13 DIAGNOSIS — Z6831 Body mass index (BMI) 31.0-31.9, adult: Secondary | ICD-10-CM | POA: Diagnosis not present

## 2023-01-13 DIAGNOSIS — A419 Sepsis, unspecified organism: Secondary | ICD-10-CM | POA: Insufficient documentation

## 2023-01-13 DIAGNOSIS — E871 Hypo-osmolality and hyponatremia: Secondary | ICD-10-CM | POA: Diagnosis present

## 2023-01-13 DIAGNOSIS — E872 Acidosis, unspecified: Secondary | ICD-10-CM | POA: Diagnosis present

## 2023-01-13 DIAGNOSIS — Z1152 Encounter for screening for COVID-19: Secondary | ICD-10-CM | POA: Diagnosis not present

## 2023-01-13 DIAGNOSIS — E7800 Pure hypercholesterolemia, unspecified: Secondary | ICD-10-CM | POA: Diagnosis present

## 2023-01-13 LAB — RESPIRATORY PANEL BY PCR

## 2023-01-13 LAB — ECHOCARDIOGRAM COMPLETE
AR max vel: 3.13 cm2
AV Area VTI: 3.67 cm2
AV Area mean vel: 3.07 cm2
AV Mean grad: 4 mmHg
AV Peak grad: 7.4 mmHg
Ao pk vel: 1.36 m/s
Area-P 1/2: 3.81 cm2
Height: 72 in
S' Lateral: 2.8 cm
Weight: 3760 oz

## 2023-01-13 LAB — URINALYSIS, W/ REFLEX TO CULTURE (INFECTION SUSPECTED)
Bacteria, UA: NONE SEEN
Bilirubin Urine: NEGATIVE
Glucose, UA: NEGATIVE mg/dL
Hgb urine dipstick: NEGATIVE
Ketones, ur: NEGATIVE mg/dL
Leukocytes,Ua: NEGATIVE
Nitrite: NEGATIVE
Protein, ur: NEGATIVE mg/dL
Specific Gravity, Urine: 1.03 (ref 1.005–1.030)
pH: 8 (ref 5.0–8.0)

## 2023-01-13 LAB — EXPECTORATED SPUTUM ASSESSMENT W GRAM STAIN, RFLX TO RESP C

## 2023-01-13 LAB — TROPONIN I (HIGH SENSITIVITY): Troponin I (High Sensitivity): 3 ng/L (ref <18)

## 2023-01-13 LAB — LACTIC ACID, PLASMA: Lactic Acid, Venous: 2.1 mmol/L (ref 0.5–1.9)

## 2023-01-13 LAB — GLUCOSE, CAPILLARY: Glucose-Capillary: 143 mg/dL — ABNORMAL HIGH (ref 70–99)

## 2023-01-13 MED ORDER — SODIUM CHLORIDE 0.9 % IV SOLN
1.0000 g | INTRAVENOUS | Status: DC
  Administered 2023-01-13: 1 g via INTRAVENOUS
  Filled 2023-01-13: qty 10

## 2023-01-13 MED ORDER — ENOXAPARIN SODIUM 40 MG/0.4ML IJ SOSY
40.0000 mg | PREFILLED_SYRINGE | INTRAMUSCULAR | Status: DC
  Administered 2023-01-13 – 2023-01-15 (×3): 40 mg via SUBCUTANEOUS
  Filled 2023-01-13 (×3): qty 0.4

## 2023-01-13 MED ORDER — ALBUTEROL SULFATE (2.5 MG/3ML) 0.083% IN NEBU
2.5000 mg | INHALATION_SOLUTION | Freq: Four times a day (QID) | RESPIRATORY_TRACT | Status: DC
  Administered 2023-01-13 – 2023-01-14 (×4): 2.5 mg via RESPIRATORY_TRACT
  Filled 2023-01-13 (×4): qty 3

## 2023-01-13 MED ORDER — ACETYLCYSTEINE 20 % IN SOLN
4.0000 mL | Freq: Two times a day (BID) | RESPIRATORY_TRACT | Status: DC
  Administered 2023-01-13 – 2023-01-14 (×3): 4 mL via RESPIRATORY_TRACT
  Filled 2023-01-13 (×4): qty 4

## 2023-01-13 MED ORDER — POTASSIUM CHLORIDE 20 MEQ PO PACK
40.0000 meq | PACK | Freq: Once | ORAL | Status: AC
  Administered 2023-01-13: 40 meq via ORAL
  Filled 2023-01-13: qty 2

## 2023-01-13 MED ORDER — SODIUM CHLORIDE 0.9 % IV SOLN
500.0000 mg | INTRAVENOUS | Status: DC
  Administered 2023-01-13: 500 mg via INTRAVENOUS
  Filled 2023-01-13: qty 5

## 2023-01-13 MED ORDER — LACTATED RINGERS IV SOLN: INTRAVENOUS | Status: AC

## 2023-01-13 MED ORDER — SODIUM CHLORIDE 0.9 % IV SOLN: 1.0000 g | INTRAVENOUS | Status: DC

## 2023-01-13 MED ORDER — SODIUM CHLORIDE 0.9 % IV SOLN
2.0000 g | Freq: Three times a day (TID) | INTRAVENOUS | Status: DC
  Administered 2023-01-13: 2 g via INTRAVENOUS
  Filled 2023-01-13: qty 12.5

## 2023-01-13 MED ORDER — SODIUM CHLORIDE 0.9 % IV SOLN
2.0000 g | Freq: Once | INTRAVENOUS | Status: AC
  Administered 2023-01-13: 2 g via INTRAVENOUS
  Filled 2023-01-13: qty 12.5

## 2023-01-13 NOTE — Plan of Care (Signed)

## 2023-01-13 NOTE — Progress Notes (Signed)
Lower extremity venous bilateral attempted. Patient IV leaking while in vascular lab. Per patient, unable to remove pants for examination. RN made aware. Will attempt again as schedule and patient condition permits.   Jean Rosenthal, RDMS, RVT

## 2023-01-13 NOTE — Assessment & Plan Note (Addendum)
A/w hemoptyis mild that has currently resolved.  However patient is still looking toxic.  I will treat the patient with albuterol as well as Mucomyst given his mucous plugging of the bronchioles.  Treat the patient with cefepime and azithromycin to cover for Pseudomonas given that patient has green sputum.  And is homeless.  We will follow-up blood cultures as ordered and a sputum culture will be done.  Maintain on telemetry and continuous pulse oximetry for this evening.  Patient has sepsis, mild lactic acidosis.  Has received a liter and a half of fluid.  We will follow-up repeat lactic acid level

## 2023-01-13 NOTE — Progress Notes (Signed)
Lower extremity venous bilateral study completed.   Please see CV Proc for preliminary results.   Trevino Wyatt, RDMS, RVT  

## 2023-01-13 NOTE — Progress Notes (Addendum)
[facility] PROGRESS NOTE   Seth Grant UJW:[id number] DOB: July 24, 1976 DOA: 01/12/2023  PCP: [name], NP  Brief History/Interval Summary: 47 y.o. male with medical history significant of reported diabetes however patient is not on any medications for the last 5 years.  Patient is also homeless and apparently was living with somebody and had to vacate the place a few days ago and was sleeping in [employer] truck.  Presented with cough and fatigue.  He was producing greenish expectoration.  Developed fever as well.  Evaluated in the ED.  Concern was for bronchopneumonia.  He was hospitalized for further management.    Consultants: None  Procedures: Echocardiogram    Subjective/Interval History: Patient mentioned that he continues to feel poorly.  Has been having cough.  Some shortness of breath.  On and off chest pain over the past several weeks.  Also complains of swelling of both of his legs ongoing for several weeks as well.  Denies any known history of heart disease.    Assessment/Plan:  Required pneumonia/acute bronchitis CT scan did not show any PE.  COVID-19 influenza and RSV PCR's were negative.  Patient was started on cefepime and azithromycin.  Will change cefepime to ceftriaxone. Will also proceed with respiratory viral panel.  Not requiring any oxygen currently. Lactic acid was mildly elevated.  WBC was normal.  Will check procalcitonin. Respiratory viral panel was positive for parainfluenza.  Pedal edema Patient mentions that he has had edema in both of his legs ongoing for several weeks.  Complains of shortness of breath at times.  Chest pain at times.  Troponin levels were normal.  BNP was 4.3.  Will proceed with echocardiogram.  Will also do lower extremity Doppler studies.  Denies any recent travel.  Hypokalemia/hyponatremia This was repleted.  Recheck labs.  History of diabetes mellitus type 2 Not on treatment for several years.  Glucose was not  significantly elevated.  Will check HbA1c.  Homelessness Request TOC to see the patient.  Obesity Estimated body mass index is 31.87 kg/m as calculated from the following:   Height as of this encounter: 6' (1.829 m).   Weight as of this encounter: 106.6 kg.   DVT Prophylaxis: Lovenox will be initiated Code Status: Full code Family Communication: Discussed with patient Disposition Plan: To be determined.  He is homeless  Status is: Inpatient Remains inpatient appropriate because: Fever, bronchopneumonia      Medications: Scheduled:  acetylcysteine  4 mL Nebulization BID   albuterol  2.5 mg Nebulization Q6H   Continuous:  azithromycin     ceFEPime (MAXIPIME) IV 2 g (01/13/23 0819)   lactated ringers 100 mL/hr at 01/13/23 0347   PRN:  Antibiotics: Anti-infectives (From admission, onward)    Start     Dose/Rate Route Frequency Ordered Stop   01/13/23 2200  azithromycin (ZITHROMAX) 500 mg in sodium chloride 0.9 % 250 mL IVPB        500 mg 250 mL/hr over 60 Minutes Intravenous Every 24 hours 01/13/23 0023 01/15/23 2159   01/13/23 0800  ceFEPIme (MAXIPIME) 2 g in sodium chloride 0.9 % 100 mL IVPB        2 g 200 mL/hr over 30 Minutes Intravenous Every 8 hours 01/13/23 0028     01/13/23 0030  cefTRIAXone (ROCEPHIN) 1 g in sodium chloride 0.9 % 100 mL IVPB  Status:  Discontinued        1 g 200 mL/hr over 30 Minutes Intravenous Every 24 hours 01/13/23 0023  01/13/23 0028   01/13/23 0030  ceFEPIme (MAXIPIME) 2 g in sodium chloride 0.9 % 100 mL IVPB        2 g 200 mL/hr over 30 Minutes Intravenous  Once 01/13/23 0030 01/13/23 0206   01/12/23 2345  azithromycin (ZITHROMAX) 500 mg in sodium chloride 0.9 % 250 mL IVPB        500 mg 250 mL/hr over 60 Minutes Intravenous  Once 01/12/23 2334 01/13/23 0053       Objective:  Vital Signs  Vitals:   01/12/23 2232 01/13/23 0000 01/13/23 0030 01/13/23 0142  BP: 132/76  135/72 (!) 118/59  Pulse: 89 (!) 103 100 93  Resp: (!) 22  (!) 25 14 18   Temp: (!) 101.2 F (38.4 C)  99.7 F (37.6 C) 98.1 F (36.7 C)  TempSrc: Axillary  Axillary Oral  SpO2: 95% 93% 93% 95%  Weight:      Height:        Intake/Output Summary (Last 24 hours) at 01/13/2023 0901 Last data filed at 01/13/2023 0347 Gross per 24 hour  Intake 500.73 ml  Output --  Net 500.73 ml   Filed Weights   01/12/23 2117  Weight: 106.6 kg    General appearance: Awake alert.  In no distress Resp: Mildly tachypneic without any use of accessory muscles.  Coarse breath sounds bilaterally with crackles at the bases.  No wheezing or rhonchi. Cardio: S1-S2 is normal regular.  No S3-S4.  No rubs murmurs or bruit GI: Abdomen is soft.  Nontender nondistended.  Bowel sounds are present normal.  No masses organomegaly Extremities: 1+ edema bilateral lower extremities Neurologic: Alert and oriented x3.  No focal neurological deficits.    Lab Results:  Data Reviewed: I have personally reviewed following labs and reports of the imaging studies  CBC: Recent Labs  Lab 01/12/23 2145  WBC 9.0  NEUTROABS 5.7  HGB 14.9  HCT 45.6  MCV 88.7  PLT 236    Basic Metabolic Panel: Recent Labs  Lab 01/12/23 2145  NA 132*  K 3.3*  CL 100  CO2 22  GLUCOSE 140*  BUN 9  CREATININE 1.19  CALCIUM 9.0    GFR: Estimated Creatinine Clearance: 97.9 mL/min (by C-G formula based on SCr of 1.19 mg/dL).  Liver Function Tests: Recent Labs  Lab 01/12/23 2145  AST 23  ALT 20  ALKPHOS 63  BILITOT 0.8  PROT 6.9  ALBUMIN 3.5    Coagulation Profile: Recent Labs  Lab 01/12/23 2145  INR 1.0    CBG: Recent Labs  Lab 01/13/23 0149  GLUCAP 143*     Recent Results (from the past 240 hour(s))  Blood Culture (routine x 2)     Status: None (Preliminary result)   Collection Time: 01/12/23  9:25 PM   Specimen: BLOOD  Result Value Ref Range Status   Specimen Description BLOOD SITE NOT SPECIFIED  Final   Special Requests   Final    BOTTLES DRAWN AEROBIC AND  ANAEROBIC Blood Culture results may not be optimal due to an inadequate volume of blood received in culture bottles   Culture   Final    NO GROWTH < 12 HOURS Performed at [facility], [address], [place], [place] [address]    Report Status PENDING  Incomplete  Resp panel by RT-PCR (RSV, Flu A&B, Covid) Anterior Nasal Swab     Status: None   Collection Time: 01/12/23  9:40 PM   Specimen: Anterior Nasal Swab  Result  Value Ref Range Status   SARS Coronavirus 2 by RT PCR NEGATIVE NEGATIVE Final   Influenza A by PCR NEGATIVE NEGATIVE Final   Influenza B by PCR NEGATIVE NEGATIVE Final    Comment: (NOTE) The Xpert Xpress SARS-CoV-2/FLU/RSV plus assay is intended as an aid in the diagnosis of influenza from Nasopharyngeal swab specimens and should not be used as a sole basis for treatment. Nasal washings and aspirates are unacceptable for Xpert Xpress SARS-CoV-2/FLU/RSV testing.  Fact Sheet for Patients: BloggerCourse.com  Fact Sheet for Healthcare Providers: SeriousBroker.it  This test is not yet approved or cleared by the [place] FDA and has been authorized for detection and/or diagnosis of SARS-CoV-2 by FDA under an Emergency Use Authorization (EUA). This EUA will remain in effect (meaning this test can be used) for the duration of the COVID-19 declaration under Section 564(b)(1) of the Act, 21 U.S.C. section 360bbb-3(b)(1), unless the authorization is terminated or revoked.     Resp Syncytial Virus by PCR NEGATIVE NEGATIVE Final    Comment: (NOTE) Fact Sheet for Patients: BloggerCourse.com  Fact Sheet for Healthcare Providers: SeriousBroker.it  This test is not yet approved or cleared by the [place] FDA and has been authorized for detection and/or diagnosis of SARS-CoV-2 by FDA under an Emergency Use Authorization (EUA). This EUA will remain in effect  (meaning this test can be used) for the duration of the COVID-19 declaration under Section 564(b)(1) of the Act, 21 U.S.C. section 360bbb-3(b)(1), unless the authorization is terminated or revoked.  Performed at [facility], [address] [place], [place] [address]   Blood Culture (routine x 2)     Status: None (Preliminary result)   Collection Time: 01/12/23  9:45 PM   Specimen: BLOOD RIGHT ARM  Result Value Ref Range Status   Specimen Description BLOOD RIGHT ARM  Final   Special Requests   Final    BOTTLES DRAWN AEROBIC ONLY Blood Culture adequate volume   Culture   Final    NO GROWTH < 12 HOURS Performed at [facility], [address] [place], [place] [address]    Report Status PENDING  Incomplete      Radiology Studies: CT Angio Chest PE W and/or Wo Contrast  Result Date: 01/12/2023 CLINICAL DATA:  Pulmonary embolism (PE) suspected, high prob Generally feeling unwell. Daily meth use. EXAM: CT ANGIOGRAPHY CHEST WITH CONTRAST TECHNIQUE: Multidetector CT imaging of the chest was performed using the standard protocol during bolus administration of intravenous contrast. Multiplanar CT image reconstructions and MIPs were obtained to evaluate the vascular anatomy. RADIATION DOSE REDUCTION: This exam was performed according to the departmental dose-optimization program which includes automated exposure control, adjustment of the mA and/or kV according to patient size and/or use of iterative reconstruction technique. CONTRAST:  75mL OMNIPAQUE IOHEXOL 350 MG/ML SOLN COMPARISON:  Radiograph earlier today FINDINGS: Cardiovascular: There are no filling defects within the pulmonary arteries to suggest pulmonary embolus. The thoracic aorta is normal. The heart is upper normal in size. There is no pericardial effusion. Mediastinum/Nodes: Shotty hilar lymph nodes, likely reactive. No mediastinal adenopathy. There are small bilateral axillary nodes, not enlarged by size criteria. No  esophageal wall thickening. Lungs/Pleura: Bronchial thickening. Mucoid impaction within the subsegmental bilateral lower lobes. Minor subsegmental atelectasis in the left lower lobe. No confluent consolidation. No pleural fluid. No pulmonary nodule. Upper Abdomen: No acute or unexpected findings. Musculoskeletal: There are no acute or suspicious osseous abnormalities. No chest wall soft tissue abnormalities Review of the  MIP images confirms the above findings. IMPRESSION: 1. No pulmonary embolus. 2. Bronchial thickening with mucous impaction in the subsegmental bilateral lower lobes. Electronically Signed   By: Narda Rutherford M.D.   On: 01/12/2023 23:22   DG Chest 2 View  Result Date: 01/12/2023 CLINICAL DATA:  Feeling generally unwell x1 day. EXAM: CHEST - 2 VIEW COMPARISON:  December 03, 2019 FINDINGS: The heart size and mediastinal contours are within normal limits. Both lungs are clear. The visualized skeletal structures are unremarkable. IMPRESSION: No active cardiopulmonary disease. Electronically Signed   By: Aram Candela M.D.   On: 01/12/2023 22:25       LOS: 0 days   Seth Grant Rito Ehrlich  [facility] Pager on [email or web address]  01/13/2023, 9:01 AM
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
## 2023-01-14 DIAGNOSIS — B348 Other viral infections of unspecified site: Secondary | ICD-10-CM | POA: Diagnosis not present

## 2023-01-14 DIAGNOSIS — J189 Pneumonia, unspecified organism: Secondary | ICD-10-CM | POA: Diagnosis not present

## 2023-01-14 LAB — MAGNESIUM: Magnesium: 2 mg/dL (ref 1.7–2.4)

## 2023-01-14 LAB — COMPREHENSIVE METABOLIC PANEL
ALT: 18 U/L (ref 0–44)
AST: 17 U/L (ref 15–41)
Albumin: 3.2 g/dL — ABNORMAL LOW (ref 3.5–5.0)
Alkaline Phosphatase: 56 U/L (ref 38–126)
Anion gap: 11 (ref 5–15)
BUN: 6 mg/dL (ref 6–20)
CO2: 24 mmol/L (ref 22–32)
Calcium: 8.6 mg/dL — ABNORMAL LOW (ref 8.9–10.3)
Chloride: 102 mmol/L (ref 98–111)
Creatinine, Ser: 1.05 mg/dL (ref 0.61–1.24)
GFR, Estimated: >60 mL/min (ref >60)
Glucose, Bld: 114 mg/dL — ABNORMAL HIGH (ref 70–99)
Potassium: 3.7 mmol/L (ref 3.5–5.1)
Sodium: 137 mmol/L (ref 135–145)
Total Bilirubin: 0.6 mg/dL (ref 0.3–1.2)
Total Protein: 6.7 g/dL (ref 6.5–8.1)

## 2023-01-14 LAB — CBC
HCT: 45.4 % (ref 39.0–52.0)
Hemoglobin: 14.9 g/dL (ref 13.0–17.0)
MCH: 29.3 pg (ref 26.0–34.0)
MCHC: 32.8 g/dL (ref 30.0–36.0)
MCV: 89.2 fL (ref 80.0–100.0)
Platelets: 214 10*3/uL (ref 150–400)
RBC: 5.09 MIL/uL (ref 4.22–5.81)
RDW: 12.9 % (ref 11.5–15.5)
WBC: 6.9 10*3/uL (ref 4.0–10.5)
nRBC: 0 % (ref 0.0–0.2)

## 2023-01-14 LAB — CULTURE, BLOOD (ROUTINE X 2): Culture: NO GROWTH

## 2023-01-14 LAB — PROCALCITONIN: Procalcitonin: <0.1 ng/mL

## 2023-01-14 LAB — CULTURE, RESPIRATORY W GRAM STAIN

## 2023-01-14 MED ORDER — DOXYCYCLINE HYCLATE 100 MG PO TABS
100.0000 mg | ORAL_TABLET | Freq: Two times a day (BID) | ORAL | Status: DC
  Administered 2023-01-14 – 2023-01-15 (×3): 100 mg via ORAL
  Filled 2023-01-14 (×3): qty 1

## 2023-01-14 MED ORDER — GUAIFENESIN 100 MG/5ML PO LIQD: 5.0000 mL | ORAL | Status: DC | PRN

## 2023-01-14 MED ORDER — GUAIFENESIN ER 600 MG PO TB12
600.0000 mg | ORAL_TABLET | Freq: Two times a day (BID) | ORAL | Status: DC
  Administered 2023-01-14 – 2023-01-15 (×3): 600 mg via ORAL
  Filled 2023-01-14 (×3): qty 1

## 2023-01-14 MED ORDER — ALBUTEROL SULFATE (2.5 MG/3ML) 0.083% IN NEBU: 2.5000 mg | INHALATION_SOLUTION | RESPIRATORY_TRACT | Status: DC | PRN

## 2023-01-14 NOTE — Progress Notes (Signed)
Patient refused to walk on the hallway.

## 2023-01-14 NOTE — Plan of Care (Signed)

## 2023-01-14 NOTE — Progress Notes (Signed)
[facility] PROGRESS NOTE   TRAYTEN ARTH Grant:[id number] DOB: 1976-06-06 DOA: 01/12/2023  PCP: Seth Grant, Seth  Brief History/Interval Summary: 47 y.o. male with medical history significant of reported diabetes however patient is not on any medications for the last 5 years.  Patient is also homeless and apparently was living with somebody and had to Grant the place a few days ago and was sleeping in [employer] truck.  Presented with cough and fatigue.  He was producing greenish expectoration.  Developed fever as well.  Evaluated in the ED.  Concern was for bronchopneumonia.  He was hospitalized for further management.    Consultants: None  Procedures: Echocardiogram    Subjective/Interval History: Continues to have a lot of cough.  Still with a greenish expectoration.  No chest pain.  Shortness of breath is present but stable.  No chest pain.  No nausea or vomiting.  He was told about the findings on the echocardiogram and the lower extremity Doppler studies.     Assessment/Plan:  Pneumonia secondary to parainfluenza CT scan did not show any PE.  COVID-19 influenza and RSV PCR's were negative.  Patient was started on cefepime and azithromycin.  Cefepime was changed over to ceftriaxone. Respiratory viral panel is positive for parainfluenza which is the likely reason for his presentation.  Will change him over to doxycycline today. WBC remains normal.  Procalcitonin is pending.  Lactic acid level was normal.  Pedal edema Patient mentions that he has had edema in both of his legs ongoing for several weeks.  Complains of shortness of breath at times.  Chest pain at times.  Troponin levels were normal.  BNP was 4.3.  Echocardiogram shows normal systolic function with EF of 55 to 60%.  Diastolic dysfunction noted either.  No significant valvular abnormalities. Lower extremity Doppler studies negative for DVT. Patient was reassured.  He was told about the findings on the  studies. He could have some venous insufficiency which could be the reason for his pedal edema.  TED stockings can be utilized.    Hypokalemia/hyponatremia Potassium and magnesium levels noted to be normal today.  History of diabetes mellitus type 2 Not on treatment for several years.  HbA1c is pending.  Homelessness TOC to see.  Obesity Estimated body mass index is 31.87 kg/m as calculated from the following:   Height as of this encounter: 6' (1.829 m).   Weight as of this encounter: 106.6 kg.   DVT Prophylaxis: Lovenox  Code Status: Full code Family Communication: Discussed with patient Disposition Plan: Start mobilizing.  Anticipate discharge in 24 to 48 hours.  Status is: Inpatient Remains inpatient appropriate because: Fever, bronchopneumonia      Medications: Scheduled:  acetylcysteine  4 mL Nebulization BID   albuterol  2.5 mg Nebulization Q6H   enoxaparin (LOVENOX) injection  40 mg Subcutaneous Q24H   Continuous:  azithromycin 500 mg (01/13/23 2138)   cefTRIAXone (ROCEPHIN)  IV 1 g (01/13/23 1615)   PRN:  Antibiotics: Anti-infectives (From admission, onward)    Start     Dose/Rate Route Frequency Ordered Stop   01/13/23 2200  azithromycin (ZITHROMAX) 500 mg in sodium chloride 0.9 % 250 mL IVPB        500 mg 250 mL/hr over 60 Minutes Intravenous Every 24 hours 01/13/23 0023 01/15/23 2159   01/13/23 1600  cefTRIAXone (ROCEPHIN) 1 g in sodium chloride 0.9 % 100 mL IVPB        1 g 200 mL/hr over 30 Minutes  Intravenous Every 24 hours 01/13/23 1304 01/18/23 1559   01/13/23 0800  ceFEPIme (MAXIPIME) 2 g in sodium chloride 0.9 % 100 mL IVPB  Status:  Discontinued        2 g 200 mL/hr over 30 Minutes Intravenous Every 8 hours 01/13/23 0028 01/13/23 1304   01/13/23 0030  cefTRIAXone (ROCEPHIN) 1 g in sodium chloride 0.9 % 100 mL IVPB  Status:  Discontinued        1 g 200 mL/hr over 30 Minutes Intravenous Every 24 hours 01/13/23 0023 01/13/23 0028   01/13/23  0030  ceFEPIme (MAXIPIME) 2 g in sodium chloride 0.9 % 100 mL IVPB        2 g 200 mL/hr over 30 Minutes Intravenous  Once 01/13/23 0030 01/13/23 0206   01/12/23 2345  azithromycin (ZITHROMAX) 500 mg in sodium chloride 0.9 % 250 mL IVPB        500 mg 250 mL/hr over 60 Minutes Intravenous  Once 01/12/23 2334 01/13/23 0053       Objective:  Vital Signs  Vitals:   01/13/23 1723 01/13/23 1945 01/14/23 0500 01/14/23 0915  BP: 139/83 132/74 135/78 125/72  Pulse: 76 94 79 90  Resp: 17 16 18 18   Temp: 98.2 F (36.8 C) 99.1 F (37.3 C) 98.5 F (36.9 C) (!) 97.5 F (36.4 C)  TempSrc: Oral Oral Oral   SpO2: 98% 96% 97% 97%  Weight:      Height:        Intake/Output Summary (Last 24 hours) at 01/14/2023 0944 Last data filed at 01/14/2023 1610 Gross per 24 hour  Intake 818 ml  Output 370 ml  Net 448 ml    Filed Weights   01/12/23 2117  Weight: 106.6 kg   General appearance: Awake alert.  In no distress Resp: Coarse sounds bilaterally.  Few crackles at the bases.  No wheezing or rhonchi.  Normal effort at rest. Cardio: S1-S2 is normal regular.  No S3-S4.  No rubs murmurs or bruit GI: Abdomen is soft.  Nontender nondistended.  Bowel sounds are present normal.  No masses organomegaly Extremities: Mild edema bilateral lower extremities.   Lab Results:  Data Reviewed: I have personally reviewed following labs and reports of the imaging studies  CBC: Recent Labs  Lab 01/12/23 2145 01/14/23 0626  WBC 9.0 6.9  NEUTROABS 5.7  --   HGB 14.9 14.9  HCT 45.6 45.4  MCV 88.7 89.2  PLT 236 214     Basic Metabolic Panel: Recent Labs  Lab 01/12/23 2145 01/14/23 0626  NA 132* 137  K 3.3* 3.7  CL 100 102  CO2 22 24  GLUCOSE 140* 114*  BUN 9 6  CREATININE 1.19 1.05  CALCIUM 9.0 8.6*  MG  --  2.0     GFR: Estimated Creatinine Clearance: 110.9 mL/min (by C-G formula based on SCr of 1.05 mg/dL).  Liver Function Tests: Recent Labs  Lab 01/12/23 2145 01/14/23 0626   AST 23 17  ALT 20 18  ALKPHOS 63 56  BILITOT 0.8 0.6  PROT 6.9 6.7  ALBUMIN 3.5 3.2*     Coagulation Profile: Recent Labs  Lab 01/12/23 2145  INR 1.0     CBG: Recent Labs  Lab 01/13/23 0149  GLUCAP 143*      Recent Results (from the past 240 hour(s))  Blood Culture (routine x 2)     Status: None (Preliminary result)   Collection Time: 01/12/23  9:25 PM   Specimen: BLOOD  Result  Value Ref Range Status   Specimen Description BLOOD SITE NOT SPECIFIED  Final   Special Requests   Final    BOTTLES DRAWN AEROBIC AND ANAEROBIC Blood Culture results may not be optimal due to an inadequate volume of blood received in culture bottles   Culture   Final    NO GROWTH 2 DAYS Performed at [facility] Lab, [address], [place], [place] [address]    Report Status PENDING  Incomplete  Resp panel by RT-PCR (RSV, Flu A&B, Covid) Anterior Nasal Swab     Status: None   Collection Time: 01/12/23  9:40 PM   Specimen: Anterior Nasal Swab  Result Value Ref Range Status   SARS Coronavirus 2 by RT PCR NEGATIVE NEGATIVE Final   Influenza A by PCR NEGATIVE NEGATIVE Final   Influenza B by PCR NEGATIVE NEGATIVE Final    Comment: (NOTE) The Xpert Xpress SARS-CoV-2/FLU/RSV plus assay is intended as an aid in the diagnosis of influenza from Nasopharyngeal swab specimens and should not be used as a sole basis for treatment. Nasal washings and aspirates are unacceptable for Xpert Xpress SARS-CoV-2/FLU/RSV testing.  Fact Sheet for Patients: BloggerCourse.com  Fact Sheet for Healthcare Providers: SeriousBroker.it  This test is not yet approved or cleared by the [place] FDA and has been authorized for detection and/or diagnosis of SARS-CoV-2 by FDA under an Emergency Use Authorization (EUA). This EUA will remain in effect (meaning this test can be used) for the duration of the COVID-19 declaration under Section 564(b)(1) of the Act,  21 U.S.C. section 360bbb-3(b)(1), unless the authorization is terminated or revoked.     Resp Syncytial Virus by PCR NEGATIVE NEGATIVE Final    Comment: (NOTE) Fact Sheet for Patients: BloggerCourse.com  Fact Sheet for Healthcare Providers: SeriousBroker.it  This test is not yet approved or cleared by the [place] FDA and has been authorized for detection and/or diagnosis of SARS-CoV-2 by FDA under an Emergency Use Authorization (EUA). This EUA will remain in effect (meaning this test can be used) for the duration of the COVID-19 declaration under Section 564(b)(1) of the Act, 21 U.S.C. section 360bbb-3(b)(1), unless the authorization is terminated or revoked.  Performed at [facility], [address], [place], [place] [address]   Blood Culture (routine x 2)     Status: None (Preliminary result)   Collection Time: 01/12/23  9:45 PM   Specimen: BLOOD RIGHT ARM  Result Value Ref Range Status   Specimen Description BLOOD RIGHT ARM  Final   Special Requests   Final    BOTTLES DRAWN AEROBIC ONLY Blood Culture adequate volume   Culture   Final    NO GROWTH 2 DAYS Performed at [facility], [address] [place], [place] [address]    Report Status PENDING  Incomplete  Respiratory (~20 pathogens) panel by PCR     Status: Abnormal   Collection Time: 01/13/23 11:05 AM   Specimen: Nasopharyngeal Swab; Respiratory  Result Value Ref Range Status   Adenovirus NOT DETECTED NOT DETECTED Final   Coronavirus 229E NOT DETECTED NOT DETECTED Final    Comment: (NOTE) The Coronavirus on the Respiratory Panel, DOES NOT test for the novel  Coronavirus (2019 nCoV)    Coronavirus HKU1 NOT DETECTED NOT DETECTED Final   Coronavirus NL63 NOT DETECTED NOT DETECTED Final   Coronavirus OC43 NOT DETECTED NOT DETECTED Final   Metapneumovirus NOT DETECTED NOT DETECTED Final   Rhinovirus / Enterovirus NOT DETECTED NOT DETECTED Final  Influenza A NOT DETECTED NOT DETECTED Final   Influenza B NOT DETECTED NOT DETECTED Final   Parainfluenza Virus 1 NOT DETECTED NOT DETECTED Final   Parainfluenza Virus 2 NOT DETECTED NOT DETECTED Final   Parainfluenza Virus 3 DETECTED (A) NOT DETECTED Final   Parainfluenza Virus 4 NOT DETECTED NOT DETECTED Final   Respiratory Syncytial Virus NOT DETECTED NOT DETECTED Final   Bordetella pertussis NOT DETECTED NOT DETECTED Final   Bordetella Parapertussis NOT DETECTED NOT DETECTED Final   Chlamydophila pneumoniae NOT DETECTED NOT DETECTED Final   Mycoplasma pneumoniae NOT DETECTED NOT DETECTED Final    Comment: Performed at [facility], [address] [place], [place] [address]  Expectorated Sputum Assessment w Gram Stain, Rflx to Resp Cult     Status: None   Collection Time: 01/13/23 12:17 PM   Specimen: Expectorated Sputum  Result Value Ref Range Status   Specimen Description EXPECTORATED SPUTUM  Final   Special Requests NONE  Final   Sputum evaluation   Final    THIS SPECIMEN IS ACCEPTABLE FOR SPUTUM CULTURE Performed at [facility] Lab, [address], [place], [place] [address]    Report Status 01/13/2023 FINAL  Final  Culture, Respiratory w Gram Stain     Status: None (Preliminary result)   Collection Time: 01/13/23 12:17 PM  Result Value Ref Range Status   Specimen Description EXPECTORATED SPUTUM  Final   Special Requests NONE Reflexed from [id number]  Final   Gram Stain   Final    RARE WBC PRESENT, PREDOMINANTLY MONONUCLEAR RARE GRAM NEGATIVE RODS RARE GRAM POSITIVE COCCI IN PAIRS IN SINGLES Performed at [facility], [address] [place], [place] [address]    Culture PENDING  Incomplete   Report Status PENDING  Incomplete      Radiology Studies: VAS [place] LOWER EXTREMITY VENOUS (DVT)  Result Date: 01/13/2023  Lower Venous DVT Study Patient Name:  JAYVAN ROPER  Date of Exam:   01/13/2023 Medical Rec #: [id number]       Accession #:    [id number] Date of  Birth: July 29, 1976      Patient Gender: M Patient Age:   55 years Exam Location:  [facility] Procedure:      VAS [place] LOWER EXTREMITY VENOUS (DVT) Referring Phys: Osvaldo Shipper --------------------------------------------------------------------------------  Indications: Tachypneic and febrile, PE protocol.  Comparison Study: CTA negative for PE. Performing Technologist: Jean Rosenthal RDMS, RVT  Examination Guidelines: A complete evaluation includes B-mode imaging, spectral Doppler, color Doppler, and power Doppler as needed of all accessible portions of each vessel. Bilateral testing is considered an integral part of a complete examination. Limited examinations for reoccurring indications may be performed as noted. The reflux portion of the exam is performed with the patient in reverse Trendelenburg.  +---------+---------------+---------+-----------+----------+--------------+ RIGHT    CompressibilityPhasicitySpontaneityPropertiesThrombus Aging +---------+---------------+---------+-----------+----------+--------------+ CFV      Full           Yes      Yes                                 +---------+---------------+---------+-----------+----------+--------------+ SFJ      Full                                                        +---------+---------------+---------+-----------+----------+--------------+  FV Prox  Full                                                        +---------+---------------+---------+-----------+----------+--------------+ FV Mid   Full                                                        +---------+---------------+---------+-----------+----------+--------------+ FV DistalFull                                                        +---------+---------------+---------+-----------+----------+--------------+ PFV      Full                                                         +---------+---------------+---------+-----------+----------+--------------+ POP      Full           Yes      Yes                                 +---------+---------------+---------+-----------+----------+--------------+ PTV      Full                                                        +---------+---------------+---------+-----------+----------+--------------+ PERO     Full                                                        +---------+---------------+---------+-----------+----------+--------------+   +---------+---------------+---------+-----------+----------+--------------+ LEFT     CompressibilityPhasicitySpontaneityPropertiesThrombus Aging +---------+---------------+---------+-----------+----------+--------------+ CFV      Full           Yes      Yes                                 +---------+---------------+---------+-----------+----------+--------------+ SFJ      Full                                                        +---------+---------------+---------+-----------+----------+--------------+ FV Prox  Full                                                        +---------+---------------+---------+-----------+----------+--------------+  FV Mid   Full                                                        +---------+---------------+---------+-----------+----------+--------------+ FV DistalFull                                                        +---------+---------------+---------+-----------+----------+--------------+ PFV      Full                                                        +---------+---------------+---------+-----------+----------+--------------+ POP      Full           Yes      Yes                                 +---------+---------------+---------+-----------+----------+--------------+ PTV      Full                                                         +---------+---------------+---------+-----------+----------+--------------+ PERO     Full                                                        +---------+---------------+---------+-----------+----------+--------------+     Summary: RIGHT: - There is no evidence of deep vein thrombosis in the lower extremity.  - No cystic structure found in the popliteal fossa.  LEFT: - There is no evidence of deep vein thrombosis in the lower extremity.  - No cystic structure found in the popliteal fossa.  *See table(s) above for measurements and observations. Electronically signed by Waverly Ferrari MD on 01/13/2023 at 7:59:25 PM.    Final    ECHOCARDIOGRAM COMPLETE  Result Date: 01/13/2023    ECHOCARDIOGRAM REPORT   Patient Name:   CORTLEN DUBS Date of Exam: 01/13/2023 Medical Rec #:  [id number]      Height:       72.0 in Accession #:    [id number]     Weight:       235.0 lb Date of Birth:  08/05/1976     BSA:          2.282 m Patient Age:    46 years       BP:           118/59 mmHg Patient Gender: M              HR:           81 bpm. Exam Location:  Inpatient Procedure: 2D Echo, Cardiac Doppler and Color Doppler Indications:    Dyspnea  History:        Patient has no prior history of Echocardiogram examinations.                 Signs/Symptoms:Dyspnea.  Sonographer:    Darlys Gales Referring Phys: [id number] Osvaldo Shipper IMPRESSIONS  1. Left ventricular ejection fraction, by estimation, is 55 to 60%. The left ventricle has normal function. The left ventricle has no regional wall motion abnormalities. Left ventricular diastolic parameters were normal.  2. Right ventricular systolic function is normal. The right ventricular size is normal.  3. The mitral valve is normal in structure. No evidence of mitral valve regurgitation. No evidence of mitral stenosis.  4. The aortic valve is tricuspid. Aortic valve regurgitation is not visualized. No aortic stenosis is present.  5. The inferior vena cava is normal in size with  greater than 50% respiratory variability, suggesting right atrial pressure of 3 mmHg. FINDINGS  Left Ventricle: Left ventricular ejection fraction, by estimation, is 55 to 60%. The left ventricle has normal function. The left ventricle has no regional wall motion abnormalities. The left ventricular internal cavity size was normal in size. There is  no left ventricular hypertrophy. Left ventricular diastolic parameters were normal. Right Ventricle: The right ventricular size is normal. No increase in right ventricular wall thickness. Right ventricular systolic function is normal. Left Atrium: Left atrial size was normal in size. Right Atrium: Right atrial size was normal in size. Pericardium: There is no evidence of pericardial effusion. Mitral Valve: The mitral valve is normal in structure. No evidence of mitral valve regurgitation. No evidence of mitral valve stenosis. Tricuspid Valve: The tricuspid valve is normal in structure. Tricuspid valve regurgitation is trivial. No evidence of tricuspid stenosis. Aortic Valve: The aortic valve is tricuspid. Aortic valve regurgitation is not visualized. No aortic stenosis is present. Aortic valve mean gradient measures 4.0 mmHg. Aortic valve peak gradient measures 7.4 mmHg. Aortic valve area, by VTI measures 3.67 cm. Pulmonic Valve: The pulmonic valve was normal in structure. Pulmonic valve regurgitation is not visualized. No evidence of pulmonic stenosis. Aorta: The aortic root is normal in size and structure. Venous: The inferior vena cava is normal in size with greater than 50% respiratory variability, suggesting right atrial pressure of 3 mmHg. IAS/Shunts: The interatrial septum was not well visualized.  LEFT VENTRICLE PLAX 2D LVIDd:         4.70 cm   Diastology LVIDs:         2.80 cm   LV e' medial:    8.81 cm/s LV PW:         0.80 cm   LV E/e' medial:  7.9 LV IVS:        1.00 cm   LV e' lateral:   11.20 cm/s LVOT diam:     2.20 cm   LV E/e' lateral: 6.2 LV SV:          92 LV SV Index:   40 LVOT Area:     3.80 cm  IVC IVC diam: 2.40 cm LEFT ATRIUM             Index        RIGHT ATRIUM           Index LA Vol (A2C):   32.2 ml 14.11 ml/m  RA Area:     21.80 cm LA Vol (A4C):   67.5 ml 29.58 ml/m  RA Volume:   64.50 ml  28.27 ml/m LA Biplane Vol: 47.1 ml 20.64 ml/m  AORTIC  VALVE AV Area (Vmax):    3.13 cm AV Area (Vmean):   3.07 cm AV Area (VTI):     3.67 cm AV Vmax:           136.00 cm/s AV Vmean:          99.900 cm/s AV VTI:            0.252 m AV Peak Grad:      7.4 mmHg AV Mean Grad:      4.0 mmHg LVOT Vmax:         112.00 cm/s LVOT Vmean:        80.600 cm/s LVOT VTI:          0.243 m LVOT/AV VTI ratio: 0.96 MITRAL VALVE               TRICUSPID VALVE MV Area (PHT): 3.81 cm    TR Peak grad:   19.9 mmHg MV Decel Time: 199 msec    TR Vmax:        223.00 cm/s MV E velocity: 69.60 cm/s MV A velocity: 75.90 cm/s  SHUNTS MV E/A ratio:  0.92        Systemic VTI:  0.24 m                            Systemic Diam: 2.20 cm Charlton Haws MD Electronically signed by Charlton Haws MD Signature Date/Time: 01/13/2023/11:29:58 AM    Final    CT Angio Chest PE W and/or Wo Contrast  Result Date: 01/12/2023 CLINICAL DATA:  Pulmonary embolism (PE) suspected, high prob Generally feeling unwell. Daily meth use. EXAM: CT ANGIOGRAPHY CHEST WITH CONTRAST TECHNIQUE: Multidetector CT imaging of the chest was performed using the standard protocol during bolus administration of intravenous contrast. Multiplanar CT image reconstructions and MIPs were obtained to evaluate the vascular anatomy. RADIATION DOSE REDUCTION: This exam was performed according to the departmental dose-optimization program which includes automated exposure control, adjustment of the mA and/or kV according to patient size and/or use of iterative reconstruction technique. CONTRAST:  75mL OMNIPAQUE IOHEXOL 350 MG/ML SOLN COMPARISON:  Radiograph earlier today FINDINGS: Cardiovascular: There are no filling defects within the pulmonary  arteries to suggest pulmonary embolus. The thoracic aorta is normal. The heart is upper normal in size. There is no pericardial effusion. Mediastinum/Nodes: Shotty hilar lymph nodes, likely reactive. No mediastinal adenopathy. There are small bilateral axillary nodes, not enlarged by size criteria. No esophageal wall thickening. Lungs/Pleura: Bronchial thickening. Mucoid impaction within the subsegmental bilateral lower lobes. Minor subsegmental atelectasis in the left lower lobe. No confluent consolidation. No pleural fluid. No pulmonary nodule. Upper Abdomen: No acute or unexpected findings. Musculoskeletal: There are no acute or suspicious osseous abnormalities. No chest wall soft tissue abnormalities Review of the MIP images confirms the above findings. IMPRESSION: 1. No pulmonary embolus. 2. Bronchial thickening with mucous impaction in the subsegmental bilateral lower lobes. Electronically Signed   By: Narda Rutherford M.D.   On: 01/12/2023 23:22   DG Chest 2 View  Result Date: 01/12/2023 CLINICAL DATA:  Feeling generally unwell x1 day. EXAM: CHEST - 2 VIEW COMPARISON:  December 03, 2019 FINDINGS: The heart size and mediastinal contours are within normal limits. Both lungs are clear. The visualized skeletal structures are unremarkable. IMPRESSION: No active cardiopulmonary disease. Electronically Signed   By: Aram Candela M.D.   On: 01/12/2023 22:25       LOS: 1 day   Osvaldo Shipper  [facility] Web designer on [email or web address]  01/14/2023, 9:44 AM

## 2023-01-15 DIAGNOSIS — J189 Pneumonia, unspecified organism: Secondary | ICD-10-CM | POA: Diagnosis not present

## 2023-01-15 LAB — CULTURE, RESPIRATORY W GRAM STAIN: Culture: NORMAL

## 2023-01-15 MED ORDER — GUAIFENESIN ER 600 MG PO TB12: 600.0000 mg | ORAL_TABLET | Freq: Two times a day (BID) | ORAL | 0 refills | Status: AC

## 2023-01-15 MED ORDER — DOXYCYCLINE HYCLATE 100 MG PO TABS: 100.0000 mg | ORAL_TABLET | Freq: Two times a day (BID) | ORAL | 0 refills | Status: AC

## 2023-01-15 MED ORDER — GUAIFENESIN 100 MG/5ML PO LIQD: 5.0000 mL | ORAL | 0 refills | Status: AC | PRN

## 2023-01-15 NOTE — Progress Notes (Signed)
CSW received consult for patient as he is homeless. CSW added shelter resources to patient's AVS.  Edwin Dada, MSW, LCSW Transitions of Care  Clinical Social Worker II [phone]

## 2023-01-15 NOTE — Plan of Care (Signed)

## 2023-01-15 NOTE — Discharge Summary (Signed)
[facility]  Physician Discharge Summary   Patient ID: Seth Grant MRN: [id number] DOB/AGE: 09-25-1975 47 y.o.  Admit date: 01/12/2023 Discharge date: 01/15/2023    PCP: [name], NP  DISCHARGE DIAGNOSES:    Community acquired pneumonia   Acute bronchitis Parainfluenza infection   RECOMMENDATIONS FOR OUTPATIENT FOLLOW UP: Please check HbA1c.  HbA1c was ordered here in the hospital but not enough blood could be obtained from the patient and so the order got canceled.    Home Health: None Equipment/Devices: None  CODE STATUS: Full code  DISCHARGE CONDITION: fair  Diet recommendation: As before  INITIAL HISTORY: 47 y.o. male with medical history significant of reported diabetes however patient is not on any medications for the last 5 years.  Patient is also homeless and apparently was living with somebody and had to vacate the place a few days ago and was sleeping in [employer] truck.  Presented with cough and fatigue.  He was producing greenish expectoration.  Developed fever as well.  Evaluated in the ED.  Concern was for bronchopneumonia.  He was hospitalized for further management.     Consultants: None   Procedures: Echocardiogram   HOSPITAL COURSE:   Pneumonia secondary to parainfluenza CT scan did not show any PE.  COVID-19 influenza and RSV PCR's were negative.  Patient was started on cefepime and azithromycin.  Cefepime was changed over to ceftriaxone. Respiratory viral panel is positive for parainfluenza which is the likely reason for his presentation.  He was changed over to doxycycline.  Condition improved gradually.  Okay for discharge today.   Pedal edema Patient mentions that he has had edema in both of his legs ongoing for several weeks.  Complains of shortness of breath at times.  Chest pain at times.  Troponin levels were normal.  BNP was 4.3.  Echocardiogram shows normal systolic function with EF of 55 to 60%.  Diastolic dysfunction noted  either.  No significant valvular abnormalities. Lower extremity Doppler studies negative for DVT. Patient was reassured.  He was told about the findings on the studies. He could have some venous insufficiency which could be the reason for his pedal edema.  TED stockings can be utilized.     Hypokalemia/hyponatremia Supplemented   History of diabetes mellitus type 2 Not on treatment for several years.  Glucose levels reasonable during this hospital stay.  Patient mentioned that he has lost a lot of weight, approximately 40 pounds, over the last 2 to 3 years.  HbA1c was ordered but got canceled due to not enough blood sample available to run the test.  This will have to be pursued in the outpatient setting.   Homelessness Resources provided by [facility]   Obesity Estimated body mass index is 31.87 kg/m as calculated from the following:   Height as of this encounter: 6' (1.829 m).   Weight as of this encounter: 106.6 kg.  Patient is stable.  Okay for discharge home today.  PERTINENT LABS:  The results of significant diagnostics from this hospitalization (including imaging, microbiology, ancillary and laboratory) are listed below for reference.    Microbiology: Recent Results (from the past 240 hour(s))  Blood Culture (routine x 2)     Status: None (Preliminary result)   Collection Time: 01/12/23  9:25 PM   Specimen: BLOOD  Result Value Ref Range Status   Specimen Description BLOOD SITE NOT SPECIFIED  Final   Special Requests   Final    BOTTLES DRAWN AEROBIC AND ANAEROBIC Blood Culture  results may not be optimal due to an inadequate volume of blood received in culture bottles   Culture   Final    NO GROWTH 4 DAYS Performed at [facility], [address], [place], [place] [address]    Report Status PENDING  Incomplete  Resp panel by RT-PCR (RSV, Flu A&B, Covid) Anterior Nasal Swab     Status: None   Collection Time: 01/12/23  9:40 PM   Specimen: Anterior Nasal Swab  Result  Value Ref Range Status   SARS Coronavirus 2 by RT PCR NEGATIVE NEGATIVE Final   Influenza A by PCR NEGATIVE NEGATIVE Final   Influenza B by PCR NEGATIVE NEGATIVE Final    Comment: (NOTE) The Xpert Xpress SARS-CoV-2/FLU/RSV plus assay is intended as an aid in the diagnosis of influenza from Nasopharyngeal swab specimens and should not be used as a sole basis for treatment. Nasal washings and aspirates are unacceptable for Xpert Xpress SARS-CoV-2/FLU/RSV testing.  Fact Sheet for Patients: BloggerCourse.com  Fact Sheet for Healthcare Providers: SeriousBroker.it  This test is not yet approved or cleared by the [place] FDA and has been authorized for detection and/or diagnosis of SARS-CoV-2 by FDA under an Emergency Use Authorization (EUA). This EUA will remain in effect (meaning this test can be used) for the duration of the COVID-19 declaration under Section 564(b)(1) of the Act, 21 U.S.C. section 360bbb-3(b)(1), unless the authorization is terminated or revoked.     Resp Syncytial Virus by PCR NEGATIVE NEGATIVE Final    Comment: (NOTE) Fact Sheet for Patients: BloggerCourse.com  Fact Sheet for Healthcare Providers: SeriousBroker.it  This test is not yet approved or cleared by the [place] FDA and has been authorized for detection and/or diagnosis of SARS-CoV-2 by FDA under an Emergency Use Authorization (EUA). This EUA will remain in effect (meaning this test can be used) for the duration of the COVID-19 declaration under Section 564(b)(1) of the Act, 21 U.S.C. section 360bbb-3(b)(1), unless the authorization is terminated or revoked.  Performed at [facility], [address] [place], [place] [address]   Blood Culture (routine x 2)     Status: None (Preliminary result)   Collection Time: 01/12/23  9:45 PM   Specimen: BLOOD RIGHT ARM  Result Value Ref Range  Status   Specimen Description BLOOD RIGHT ARM  Final   Special Requests   Final    BOTTLES DRAWN AEROBIC ONLY Blood Culture adequate volume   Culture   Final    NO GROWTH 4 DAYS Performed at [facility], [address], [place], [place] [address]    Report Status PENDING  Incomplete  Respiratory (~20 pathogens) panel by PCR     Status: Abnormal   Collection Time: 01/13/23 11:05 AM   Specimen: Nasopharyngeal Swab; Respiratory  Result Value Ref Range Status   Adenovirus NOT DETECTED NOT DETECTED Final   Coronavirus 229E NOT DETECTED NOT DETECTED Final    Comment: (NOTE) The Coronavirus on the Respiratory Panel, DOES NOT test for the novel  Coronavirus (2019 nCoV)    Coronavirus HKU1 NOT DETECTED NOT DETECTED Final   Coronavirus NL63 NOT DETECTED NOT DETECTED Final   Coronavirus OC43 NOT DETECTED NOT DETECTED Final   Metapneumovirus NOT DETECTED NOT DETECTED Final   Rhinovirus / Enterovirus NOT DETECTED NOT DETECTED Final   Influenza A NOT DETECTED NOT DETECTED Final   Influenza B NOT DETECTED NOT DETECTED Final   Parainfluenza Virus 1 NOT DETECTED NOT DETECTED Final  Parainfluenza Virus 2 NOT DETECTED NOT DETECTED Final   Parainfluenza Virus 3 DETECTED (A) NOT DETECTED Final   Parainfluenza Virus 4 NOT DETECTED NOT DETECTED Final   Respiratory Syncytial Virus NOT DETECTED NOT DETECTED Final   Bordetella pertussis NOT DETECTED NOT DETECTED Final   Bordetella Parapertussis NOT DETECTED NOT DETECTED Final   Chlamydophila pneumoniae NOT DETECTED NOT DETECTED Final   Mycoplasma pneumoniae NOT DETECTED NOT DETECTED Final    Comment: Performed at [facility], [address] [place], [place] [address]  Expectorated Sputum Assessment w Gram Stain, Rflx to Resp Cult     Status: None   Collection Time: 01/13/23 12:17 PM   Specimen: Expectorated Sputum  Result Value Ref Range Status   Specimen Description EXPECTORATED SPUTUM  Final   Special Requests NONE  Final   Sputum  evaluation   Final    THIS SPECIMEN IS ACCEPTABLE FOR SPUTUM CULTURE Performed at [facility], [address] [place], [place] [address]    Report Status 01/13/2023 FINAL  Final  Culture, Respiratory w Gram Stain     Status: None   Collection Time: 01/13/23 12:17 PM  Result Value Ref Range Status   Specimen Description EXPECTORATED SPUTUM  Final   Special Requests NONE Reflexed from [id number]  Final   Gram Stain   Final    RARE WBC PRESENT, PREDOMINANTLY MONONUCLEAR RARE GRAM NEGATIVE RODS RARE GRAM POSITIVE COCCI IN PAIRS IN SINGLES    Culture   Final    FEW Consistent with normal respiratory flora. Performed at [facility], [address] [place], [place] [address]    Report Status 01/15/2023 FINAL  Final     Labs:   Basic Metabolic Panel: Recent Labs  Lab 01/12/23 2145 01/14/23 0626  NA 132* 137  K 3.3* 3.7  CL 100 102  CO2 22 24  GLUCOSE 140* 114*  BUN 9 6  CREATININE 1.19 1.05  CALCIUM 9.0 8.6*  MG  --  2.0   Liver Function Tests: Recent Labs  Lab 01/12/23 2145 01/14/23 0626  AST 23 17  ALT 20 18  ALKPHOS 63 56  BILITOT 0.8 0.6  PROT 6.9 6.7  ALBUMIN 3.5 3.2*    CBC: Recent Labs  Lab 01/12/23 2145 01/14/23 0626  WBC 9.0 6.9  NEUTROABS 5.7  --   HGB 14.9 14.9  HCT 45.6 45.4  MCV 88.7 89.2  PLT 236 214    BNP: BNP (last 3 results) Recent Labs    01/12/23 2145  BNP 4.3    CBG: Recent Labs  Lab 01/13/23 0149  GLUCAP 143*     IMAGING STUDIES VAS [place] LOWER EXTREMITY VENOUS (DVT)  Result Date: 01/13/2023  Lower Venous DVT Study Patient Name:  Seth Grant  Date of Exam:   01/13/2023 Medical Rec #: [id number]       Accession #:    [id number] Date of Birth: 1976/04/03      Patient Gender: M Patient Age:   43 years Exam Location:  [facility] Procedure:      VAS [place] LOWER EXTREMITY VENOUS (DVT) Referring Phys: Osvaldo Shipper --------------------------------------------------------------------------------  Indications:  Tachypneic and febrile, PE protocol.  Comparison Study: CTA negative for PE. Performing Technologist: Jean Rosenthal RDMS, RVT  Examination Guidelines: A complete evaluation includes B-mode imaging, spectral Doppler, color Doppler, and power Doppler as needed of all accessible portions of each vessel. Bilateral testing is considered an integral part of a complete examination. Limited  examinations for reoccurring indications may be performed as noted. The reflux portion of the exam is performed with the patient in reverse Trendelenburg.  +---------+---------------+---------+-----------+----------+--------------+ RIGHT    CompressibilityPhasicitySpontaneityPropertiesThrombus Aging +---------+---------------+---------+-----------+----------+--------------+ CFV      Full           Yes      Yes                                 +---------+---------------+---------+-----------+----------+--------------+ SFJ      Full                                                        +---------+---------------+---------+-----------+----------+--------------+ FV Prox  Full                                                        +---------+---------------+---------+-----------+----------+--------------+ FV Mid   Full                                                        +---------+---------------+---------+-----------+----------+--------------+ FV DistalFull                                                        +---------+---------------+---------+-----------+----------+--------------+ PFV      Full                                                        +---------+---------------+---------+-----------+----------+--------------+ POP      Full           Yes      Yes                                 +---------+---------------+---------+-----------+----------+--------------+ PTV      Full                                                         +---------+---------------+---------+-----------+----------+--------------+ PERO     Full                                                        +---------+---------------+---------+-----------+----------+--------------+   +---------+---------------+---------+-----------+----------+--------------+ LEFT     CompressibilityPhasicitySpontaneityPropertiesThrombus Aging +---------+---------------+---------+-----------+----------+--------------+ CFV      Full  Yes      Yes                                 +---------+---------------+---------+-----------+----------+--------------+ SFJ      Full                                                        +---------+---------------+---------+-----------+----------+--------------+ FV Prox  Full                                                        +---------+---------------+---------+-----------+----------+--------------+ FV Mid   Full                                                        +---------+---------------+---------+-----------+----------+--------------+ FV DistalFull                                                        +---------+---------------+---------+-----------+----------+--------------+ PFV      Full                                                        +---------+---------------+---------+-----------+----------+--------------+ POP      Full           Yes      Yes                                 +---------+---------------+---------+-----------+----------+--------------+ PTV      Full                                                        +---------+---------------+---------+-----------+----------+--------------+ PERO     Full                                                        +---------+---------------+---------+-----------+----------+--------------+     Summary: RIGHT: - There is no evidence of deep vein thrombosis in the lower extremity.  - No cystic structure found in  the popliteal fossa.  LEFT: - There is no evidence of deep vein thrombosis in the lower extremity.  - No cystic structure found in the popliteal fossa.  *See table(s) above for measurements and observations. Electronically signed by Waverly Ferrari MD on 01/13/2023 at 7:59:25  PM.    Final    ECHOCARDIOGRAM COMPLETE  Result Date: 01/13/2023    ECHOCARDIOGRAM REPORT   Patient Name:   Seth Grant Date of Exam: 01/13/2023 Medical Rec #:  [id number]      Height:       72.0 in Accession #:    [id number]     Weight:       235.0 lb Date of Birth:  [birth year]     BSA:          2.282 m Patient Age:    46 years       BP:           118/59 mmHg Patient Gender: M              HR:           81 bpm. Exam Location:  Inpatient Procedure: 2D Echo, Cardiac Doppler and Color Doppler Indications:    Dyspnea  History:        Patient has no prior history of Echocardiogram examinations.                 Signs/Symptoms:Dyspnea.  Sonographer:    Darlys Gales Referring Phys: [id number] Osvaldo Shipper IMPRESSIONS  1. Left ventricular ejection fraction, by estimation, is 55 to 60%. The left ventricle has normal function. The left ventricle has no regional wall motion abnormalities. Left ventricular diastolic parameters were normal.  2. Right ventricular systolic function is normal. The right ventricular size is normal.  3. The mitral valve is normal in structure. No evidence of mitral valve regurgitation. No evidence of mitral stenosis.  4. The aortic valve is tricuspid. Aortic valve regurgitation is not visualized. No aortic stenosis is present.  5. The inferior vena cava is normal in size with greater than 50% respiratory variability, suggesting right atrial pressure of 3 mmHg. FINDINGS  Left Ventricle: Left ventricular ejection fraction, by estimation, is 55 to 60%. The left ventricle has normal function. The left ventricle has no regional wall motion abnormalities. The left ventricular internal cavity size was normal in size. There is  no  left ventricular hypertrophy. Left ventricular diastolic parameters were normal. Right Ventricle: The right ventricular size is normal. No increase in right ventricular wall thickness. Right ventricular systolic function is normal. Left Atrium: Left atrial size was normal in size. Right Atrium: Right atrial size was normal in size. Pericardium: There is no evidence of pericardial effusion. Mitral Valve: The mitral valve is normal in structure. No evidence of mitral valve regurgitation. No evidence of mitral valve stenosis. Tricuspid Valve: The tricuspid valve is normal in structure. Tricuspid valve regurgitation is trivial. No evidence of tricuspid stenosis. Aortic Valve: The aortic valve is tricuspid. Aortic valve regurgitation is not visualized. No aortic stenosis is present. Aortic valve mean gradient measures 4.0 mmHg. Aortic valve peak gradient measures 7.4 mmHg. Aortic valve area, by VTI measures 3.67 cm. Pulmonic Valve: The pulmonic valve was normal in structure. Pulmonic valve regurgitation is not visualized. No evidence of pulmonic stenosis. Aorta: The aortic root is normal in size and structure. Venous: The inferior vena cava is normal in size with greater than 50% respiratory variability, suggesting right atrial pressure of 3 mmHg. IAS/Shunts: The interatrial septum was not well visualized.  LEFT VENTRICLE PLAX 2D LVIDd:         4.70 cm   Diastology LVIDs:         2.80 cm   LV e' medial:    8.81 cm/s LV PW:  0.80 cm   LV E/e' medial:  7.9 LV IVS:        1.00 cm   LV e' lateral:   11.20 cm/s LVOT diam:     2.20 cm   LV E/e' lateral: 6.2 LV SV:         92 LV SV Index:   40 LVOT Area:     3.80 cm  IVC IVC diam: 2.40 cm LEFT ATRIUM             Index        RIGHT ATRIUM           Index LA Vol (A2C):   32.2 ml 14.11 ml/m  RA Area:     21.80 cm LA Vol (A4C):   67.5 ml 29.58 ml/m  RA Volume:   64.50 ml  28.27 ml/m LA Biplane Vol: 47.1 ml 20.64 ml/m  AORTIC VALVE AV Area (Vmax):    3.13 cm AV Area  (Vmean):   3.07 cm AV Area (VTI):     3.67 cm AV Vmax:           136.00 cm/s AV Vmean:          99.900 cm/s AV VTI:            0.252 m AV Peak Grad:      7.4 mmHg AV Mean Grad:      4.0 mmHg LVOT Vmax:         112.00 cm/s LVOT Vmean:        80.600 cm/s LVOT VTI:          0.243 m LVOT/AV VTI ratio: 0.96 MITRAL VALVE               TRICUSPID VALVE MV Area (PHT): 3.81 cm    TR Peak grad:   19.9 mmHg MV Decel Time: 199 msec    TR Vmax:        223.00 cm/s MV E velocity: 69.60 cm/s MV A velocity: 75.90 cm/s  SHUNTS MV E/A ratio:  0.92        Systemic VTI:  0.24 m                            Systemic Diam: 2.20 cm Charlton Haws MD Electronically signed by Charlton Haws MD Signature Date/Time: 01/13/2023/11:29:58 AM    Final    CT Angio Chest PE W and/or Wo Contrast  Result Date: 01/12/2023 CLINICAL DATA:  Pulmonary embolism (PE) suspected, high prob Generally feeling unwell. Daily meth use. EXAM: CT ANGIOGRAPHY CHEST WITH CONTRAST TECHNIQUE: Multidetector CT imaging of the chest was performed using the standard protocol during bolus administration of intravenous contrast. Multiplanar CT image reconstructions and MIPs were obtained to evaluate the vascular anatomy. RADIATION DOSE REDUCTION: This exam was performed according to the departmental dose-optimization program which includes automated exposure control, adjustment of the mA and/or kV according to patient size and/or use of iterative reconstruction technique. CONTRAST:  75mL OMNIPAQUE IOHEXOL 350 MG/ML SOLN COMPARISON:  Radiograph earlier today FINDINGS: Cardiovascular: There are no filling defects within the pulmonary arteries to suggest pulmonary embolus. The thoracic aorta is normal. The heart is upper normal in size. There is no pericardial effusion. Mediastinum/Nodes: Shotty hilar lymph nodes, likely reactive. No mediastinal adenopathy. There are small bilateral axillary nodes, not enlarged by size criteria. No esophageal wall thickening. Lungs/Pleura:  Bronchial thickening. Mucoid impaction within the subsegmental bilateral lower lobes. Minor subsegmental atelectasis in  the left lower lobe. No confluent consolidation. No pleural fluid. No pulmonary nodule. Upper Abdomen: No acute or unexpected findings. Musculoskeletal: There are no acute or suspicious osseous abnormalities. No chest wall soft tissue abnormalities Review of the MIP images confirms the above findings. IMPRESSION: 1. No pulmonary embolus. 2. Bronchial thickening with mucous impaction in the subsegmental bilateral lower lobes. Electronically Signed   By: Narda Rutherford M.D.   On: 01/12/2023 23:22   DG Chest 2 View  Result Date: 01/12/2023 CLINICAL DATA:  Feeling generally unwell x1 day. EXAM: CHEST - 2 VIEW COMPARISON:  December 03, 2019 FINDINGS: The heart size and mediastinal contours are within normal limits. Both lungs are clear. The visualized skeletal structures are unremarkable. IMPRESSION: No active cardiopulmonary disease. Electronically Signed   By: Aram Candela M.D.   On: 01/12/2023 22:25    DISCHARGE EXAMINATION: Vitals:   01/14/23 1631 01/14/23 2010 01/15/23 0459 01/15/23 0809  BP: (!) 141/70 136/71 128/77 138/84  Pulse: 73 79 72 83  Resp: 18 15 15 18   Temp: 98.6 F (37 C) 98.2 F (36.8 C) 97.8 F (36.6 C) (!) 97.5 F (36.4 C)  TempSrc:  Oral Oral Oral  SpO2: 95% 95% 94% 96%  Weight:      Height:       General appearance: Awake alert.  In no distress Resp: Coarse breath sounds bilaterally with few crackles at the bases. Cardio: S1-S2 is normal regular.  No S3-S4.  No rubs murmurs or bruit GI: Abdomen is soft.  Nontender nondistended.  Bowel sounds are present normal.  No masses organomegaly Extremities: No edema.  Full range of motion of lower extremities. Neurologic: Alert and oriented x3.  No focal neurological deficits.    DISPOSITION: Self-care  Discharge Instructions     Call MD for:  difficulty breathing, headache or visual disturbances    Complete by: As directed    Call MD for:  extreme fatigue   Complete by: As directed    Call MD for:  persistant dizziness or light-headedness   Complete by: As directed    Call MD for:  persistant nausea and vomiting   Complete by: As directed    Call MD for:  severe uncontrolled pain   Complete by: As directed    Call MD for:  temperature >100.4   Complete by: As directed    Diet - low sodium heart healthy   Complete by: As directed    Discharge instructions   Complete by: As directed    Please take your medications as prescribed.  Seek attention if your symptoms were to worsen.  Follow-up with primary care provider for further evaluation of elevated glucose levels.  You were cared for by a hospitalist during your hospital stay. If you have any questions about your discharge medications or the care you received while you were in the hospital after you are discharged, you can call the unit and asked to speak with the hospitalist on call if the hospitalist that took care of you is not available. Once you are discharged, your primary care physician will handle any further medical issues. Please note that NO REFILLS for any discharge medications will be authorized once you are discharged, as it is imperative that you return to your primary care physician (or establish a relationship with a primary care physician if you do not have one) for your aftercare needs so that they can reassess your need for medications and monitor your lab values. If you do not  have a primary care physician, you can call [phone] for a physician referral.   Increase activity slowly   Complete by: As directed          Allergies as of 01/15/2023   No Known Allergies      Medication List     TAKE these medications    doxycycline 100 MG tablet Commonly known as: VIBRA-TABS Take 1 tablet (100 mg total) by mouth every 12 (twelve) hours for 3 days.   guaiFENesin 600 MG 12 hr tablet Commonly known as:  MUCINEX Take 1 tablet (600 mg total) by mouth 2 (two) times daily for 10 days.   guaiFENesin 100 MG/5ML liquid Commonly known as: ROBITUSSIN Take 5 mLs by mouth every 4 (four) hours as needed for cough or to loosen phlegm.          Follow-up Information     [facility]. Schedule an appointment as soon as possible for a visit.   Contact information: [address] [place] [address] [phone]                TOTAL DISCHARGE TIME: 35 minutes  Marijke Guadiana Rito Ehrlich  [facility] Pager on [email or web address]  01/16/2023, 10:06 AM

## 2023-01-15 NOTE — TOC Transition Note (Signed)
Transition of Care [facility]) - CM/SW Discharge Note   Patient Details  Name: Seth Grant MRN: [id number] Date of Birth: [birth year]  Transition of Care [facility]) CM/SW Contact:  Ronny Bacon, RN Phone Number: 01/15/2023, 10:15 AM   Clinical Narrative:  Spoke with patient at bedside. Patient currently lives in his car and does not work at this time. Patient called his daughter for ride home and she agreed. Patient is connected to [facility] and uses them for PCP. Patient encouraged to follow up the medical clinic at [facility] for continuation of care. Patient requested clothing to go home in, floor staff notified.     Final next level of care: Home/Self Care Barriers to Discharge: No Barriers Identified   Patient Goals and CMS Choice      Discharge Placement                         Discharge Plan and Services Additional resources added to the After Visit Summary for                    DME Agency: NA                  Social Determinants of Health (SDOH) Interventions SDOH Screenings   Tobacco Use: High Risk (01/12/2023)     Readmission Risk Interventions     No data to display

## 2023-01-16 LAB — CULTURE, BLOOD (ROUTINE X 2)

## 2023-01-17 LAB — CULTURE, BLOOD (ROUTINE X 2)
Culture: NO GROWTH
Special Requests: ADEQUATE
